# Patient Record
Sex: Female | Born: 1960 | Race: Black or African American | Hispanic: No | Marital: Married | Smoking: Former smoker
Health system: Southern US, Community
[De-identification: ages and names within clinical notes are randomized; demographics above are authoritative.]

## PROBLEM LIST (undated history)

## (undated) DIAGNOSIS — R569 Unspecified convulsions: Secondary | ICD-10-CM

---

## 2008-10-29 ENCOUNTER — Emergency Department: Payer: Self-pay | Admitting: Emergency Medicine

## 2012-05-12 ENCOUNTER — Emergency Department: Payer: Self-pay | Admitting: Emergency Medicine

## 2012-05-12 LAB — CBC
HGB: 10.2 g/dL — ABNORMAL LOW (ref 12.0–16.0)
MCH: 35.1 pg — ABNORMAL HIGH (ref 26.0–34.0)
MCV: 101 fL — ABNORMAL HIGH (ref 80–100)
RBC: 2.91 10*6/uL — ABNORMAL LOW (ref 3.80–5.20)
RDW: 19.4 % — ABNORMAL HIGH (ref 11.5–14.5)
WBC: 4.2 10*3/uL (ref 3.6–11.0)

## 2012-05-12 LAB — BASIC METABOLIC PANEL
Chloride: 104 mmol/L (ref 98–107)
EGFR (African American): >60
Osmolality: 274 (ref 275–301)
Potassium: 3.5 mmol/L (ref 3.5–5.1)

## 2012-08-10 ENCOUNTER — Emergency Department: Payer: Self-pay | Admitting: Emergency Medicine

## 2012-08-10 LAB — URINALYSIS, COMPLETE
Bilirubin,UR: NEGATIVE
Blood: NEGATIVE
Ketone: NEGATIVE
Nitrite: NEGATIVE
Protein: NEGATIVE
Specific Gravity: 1.039 (ref 1.003–1.030)
Squamous Epithelial: 1
WBC UR: <1 /HPF (ref 0–5)

## 2012-08-10 LAB — COMPREHENSIVE METABOLIC PANEL
Alkaline Phosphatase: 163 U/L — ABNORMAL HIGH (ref 50–136)
Anion Gap: 11 (ref 7–16)
BUN: 8 mg/dL (ref 7–18)
Creatinine: 0.74 mg/dL (ref 0.60–1.30)
EGFR (African American): >60
EGFR (Non-African Amer.): >60
Glucose: 161 mg/dL — ABNORMAL HIGH (ref 65–99)
Osmolality: 276 (ref 275–301)
Potassium: 3.5 mmol/L (ref 3.5–5.1)
Sodium: 137 mmol/L (ref 136–145)

## 2012-08-10 LAB — CBC
HCT: 33.4 % — ABNORMAL LOW (ref 35.0–47.0)
MCHC: 34.5 g/dL (ref 32.0–36.0)
MCV: 99 fL (ref 80–100)
RBC: 3.37 10*6/uL — ABNORMAL LOW (ref 3.80–5.20)
WBC: 3.1 10*3/uL — ABNORMAL LOW (ref 3.6–11.0)

## 2012-08-11 ENCOUNTER — Ambulatory Visit: Payer: Self-pay | Admitting: Oncology

## 2012-08-11 LAB — CBC CANCER CENTER
Basophil %: 0.5 %
Eosinophil #: 0 x10 3/mm (ref 0.0–0.7)
HCT: 33.8 % — ABNORMAL LOW (ref 35.0–47.0)
HGB: 11.6 g/dL — ABNORMAL LOW (ref 12.0–16.0)
Lymphocyte #: 0.5 x10 3/mm — ABNORMAL LOW (ref 1.0–3.6)
MCH: 33.7 pg (ref 26.0–34.0)
Monocyte #: 0.2 x10 3/mm (ref 0.2–0.9)
Neutrophil %: 79 %
RBC: 3.43 10*6/uL — ABNORMAL LOW (ref 3.80–5.20)
RDW: 14.3 % (ref 11.5–14.5)

## 2012-08-11 LAB — FOLATE: Folic Acid: 19.1 ng/mL (ref 3.1–100.0)

## 2012-08-11 LAB — ETHANOL: Ethanol: <3 mg/dL

## 2012-08-11 LAB — IRON AND TIBC
Iron Bind.Cap.(Total): 324 ug/dL (ref 250–450)
Iron Saturation: 29 %
Iron: 94 ug/dL (ref 50–170)
Unbound Iron-Bind.Cap.: 230 ug/dL

## 2012-08-11 LAB — FERRITIN: Ferritin (ARMC): 1679 ng/mL — ABNORMAL HIGH (ref 8–388)

## 2012-08-11 LAB — LACTATE DEHYDROGENASE: LDH: 281 U/L — ABNORMAL HIGH (ref 81–246)

## 2012-08-13 LAB — URINE IEP, RANDOM

## 2012-08-14 ENCOUNTER — Ambulatory Visit: Payer: Self-pay | Admitting: Oncology

## 2012-08-20 LAB — CBC CANCER CENTER
Eosinophil #: 0.1 x10 3/mm (ref 0.0–0.7)
Eosinophil %: 1.8 %
HGB: 11.5 g/dL — ABNORMAL LOW (ref 12.0–16.0)
Lymphocyte #: 1.2 x10 3/mm (ref 1.0–3.6)
MCH: 34.9 pg — ABNORMAL HIGH (ref 26.0–34.0)
MCHC: 34.7 g/dL (ref 32.0–36.0)
Monocyte #: 0.8 x10 3/mm (ref 0.2–0.9)
Neutrophil #: 3 x10 3/mm (ref 1.4–6.5)
Neutrophil %: 58.1 %
Platelet: 225 x10 3/mm (ref 150–440)
RBC: 3.3 10*6/uL — ABNORMAL LOW (ref 3.80–5.20)
RDW: 13.6 % (ref 11.5–14.5)

## 2012-09-14 ENCOUNTER — Ambulatory Visit: Payer: Self-pay | Admitting: Oncology

## 2012-09-17 LAB — CBC CANCER CENTER
Basophil %: 1.1 %
Eosinophil %: 4.4 %
HCT: 35.6 % (ref 35.0–47.0)
Lymphocyte %: 23.9 %
MCH: 32.2 pg (ref 26.0–34.0)
MCV: 96 fL (ref 80–100)
Neutrophil #: 4.2 x10 3/mm (ref 1.4–6.5)
Neutrophil %: 61 %
Platelet: 209 x10 3/mm (ref 150–440)
RBC: 3.71 10*6/uL — ABNORMAL LOW (ref 3.80–5.20)

## 2012-10-14 ENCOUNTER — Ambulatory Visit: Payer: Self-pay | Admitting: Oncology

## 2012-10-15 LAB — CBC CANCER CENTER
Basophil %: 0.9 %
Eosinophil #: 0.3 x10 3/mm (ref 0.0–0.7)
Eosinophil %: 4.4 %
HGB: 12.2 g/dL (ref 12.0–16.0)
Lymphocyte #: 1.7 x10 3/mm (ref 1.0–3.6)
Lymphocyte %: 26.7 %
MCH: 32.1 pg (ref 26.0–34.0)
Monocyte #: 0.6 x10 3/mm (ref 0.2–0.9)
Monocyte %: 9 %
Neutrophil %: 59 %
Platelet: 240 x10 3/mm (ref 150–440)
RDW: 13 % (ref 11.5–14.5)

## 2012-11-14 ENCOUNTER — Ambulatory Visit: Payer: Self-pay | Admitting: Oncology

## 2012-12-07 LAB — CBC CANCER CENTER
Basophil #: 0.1 x10 3/mm (ref 0.0–0.1)
Basophil %: 1.1 %
Eosinophil #: 0.2 x10 3/mm (ref 0.0–0.7)
HCT: 34.5 % — ABNORMAL LOW (ref 35.0–47.0)
HGB: 11.6 g/dL — ABNORMAL LOW (ref 12.0–16.0)
Lymphocyte %: 27.2 %
MCV: 90 fL (ref 80–100)
Monocyte #: 0.4 x10 3/mm (ref 0.2–0.9)
Monocyte %: 7.1 %
Platelet: 231 x10 3/mm (ref 150–440)
RBC: 3.83 10*6/uL (ref 3.80–5.20)
WBC: 5.1 x10 3/mm (ref 3.6–11.0)

## 2012-12-14 ENCOUNTER — Ambulatory Visit: Payer: Self-pay | Admitting: Oncology

## 2013-01-29 ENCOUNTER — Ambulatory Visit: Payer: Self-pay | Admitting: Oncology

## 2013-01-29 LAB — CBC CANCER CENTER
BASOS PCT: 1.4 %
Basophil #: 0.1 x10 3/mm (ref 0.0–0.1)
EOS PCT: 4 %
Eosinophil #: 0.2 x10 3/mm (ref 0.0–0.7)
HCT: 37 % (ref 35.0–47.0)
HGB: 12.3 g/dL (ref 12.0–16.0)
Lymphocyte #: 1.4 x10 3/mm (ref 1.0–3.6)
Lymphocyte %: 28.3 %
MCH: 31.4 pg (ref 26.0–34.0)
MCHC: 33.2 g/dL (ref 32.0–36.0)
MCV: 95 fL (ref 80–100)
Monocyte #: 0.5 x10 3/mm (ref 0.2–0.9)
Monocyte %: 9.6 %
Neutrophil #: 2.8 x10 3/mm (ref 1.4–6.5)
Neutrophil %: 56.7 %
PLATELETS: 163 x10 3/mm (ref 150–440)
RBC: 3.92 10*6/uL (ref 3.80–5.20)
RDW: 14.9 % — AB (ref 11.5–14.5)
WBC: 4.9 x10 3/mm (ref 3.6–11.0)

## 2013-02-14 ENCOUNTER — Ambulatory Visit: Payer: Self-pay | Admitting: Oncology

## 2013-03-14 ENCOUNTER — Ambulatory Visit: Payer: Self-pay | Admitting: Oncology

## 2015-04-11 ENCOUNTER — Encounter: Payer: Self-pay | Admitting: Emergency Medicine

## 2015-04-11 ENCOUNTER — Emergency Department
Admission: EM | Admit: 2015-04-11 | Discharge: 2015-04-11 | Disposition: A | Discharge status: Home / Self Care | Payer: BLUE CROSS/BLUE SHIELD | Attending: Emergency Medicine | Admitting: Emergency Medicine

## 2015-04-11 DIAGNOSIS — B348 Other viral infections of unspecified site: Secondary | ICD-10-CM | POA: Diagnosis not present

## 2015-04-11 DIAGNOSIS — R112 Nausea with vomiting, unspecified: Secondary | ICD-10-CM | POA: Insufficient documentation

## 2015-04-11 DIAGNOSIS — A0811 Acute gastroenteropathy due to Norwalk agent: Secondary | ICD-10-CM

## 2015-04-11 DIAGNOSIS — R55 Syncope and collapse: Secondary | ICD-10-CM | POA: Insufficient documentation

## 2015-04-11 DIAGNOSIS — Z87891 Personal history of nicotine dependence: Secondary | ICD-10-CM | POA: Diagnosis not present

## 2015-04-11 DIAGNOSIS — R197 Diarrhea, unspecified: Secondary | ICD-10-CM | POA: Insufficient documentation

## 2015-04-11 LAB — COMPREHENSIVE METABOLIC PANEL
ALT: 54 U/L (ref 14–54)
AST: 157 U/L — AB (ref 15–41)
Albumin: 4.4 g/dL (ref 3.5–5.0)
Alkaline Phosphatase: 79 U/L (ref 38–126)
Anion gap: 16 — ABNORMAL HIGH (ref 5–15)
BUN: 18 mg/dL (ref 6–20)
CHLORIDE: 97 mmol/L — AB (ref 101–111)
CO2: 21 mmol/L — ABNORMAL LOW (ref 22–32)
CREATININE: 0.74 mg/dL (ref 0.44–1.00)
Calcium: 8.7 mg/dL — ABNORMAL LOW (ref 8.9–10.3)
GFR calc Af Amer: >60 mL/min (ref >60)
Glucose, Bld: 161 mg/dL — ABNORMAL HIGH (ref 65–99)
Potassium: 3 mmol/L — ABNORMAL LOW (ref 3.5–5.1)
Sodium: 134 mmol/L — ABNORMAL LOW (ref 135–145)
Total Bilirubin: 1.7 mg/dL — ABNORMAL HIGH (ref 0.3–1.2)
Total Protein: 8.4 g/dL — ABNORMAL HIGH (ref 6.5–8.1)

## 2015-04-11 LAB — CBC WITH DIFFERENTIAL/PLATELET
BASOS ABS: 0 10*3/uL (ref 0–0.1)
Basophils Relative: 0 %
EOS PCT: 0 %
Eosinophils Absolute: 0 10*3/uL (ref 0–0.7)
HEMATOCRIT: 35.1 % (ref 35.0–47.0)
Hemoglobin: 12.1 g/dL (ref 12.0–16.0)
LYMPHS PCT: 9 %
Lymphs Abs: 0.5 10*3/uL — ABNORMAL LOW (ref 1.0–3.6)
MCH: 34.5 pg — ABNORMAL HIGH (ref 26.0–34.0)
MCHC: 34.4 g/dL (ref 32.0–36.0)
MCV: 100.4 fL — AB (ref 80.0–100.0)
Monocytes Absolute: 0.3 10*3/uL (ref 0.2–0.9)
Monocytes Relative: 5 %
NEUTROS ABS: 5.4 10*3/uL (ref 1.4–6.5)
NEUTROS PCT: 86 %
PLATELETS: 127 10*3/uL — AB (ref 150–440)
RBC: 3.49 MIL/uL — AB (ref 3.80–5.20)
RDW: 12.3 % (ref 11.5–14.5)
WBC: 6.2 10*3/uL (ref 3.6–11.0)

## 2015-04-11 LAB — TROPONIN I

## 2015-04-11 MED ORDER — SODIUM CHLORIDE 0.9 % IV SOLN
1000.0000 mL | Freq: Once | INTRAVENOUS | Status: AC
  Administered 2015-04-11: 1000 mL via INTRAVENOUS

## 2015-04-11 MED ORDER — LOPERAMIDE HCL 2 MG PO CAPS
4.0000 mg | ORAL_CAPSULE | Freq: Once | ORAL | Status: AC
  Administered 2015-04-11: 4 mg via ORAL
  Filled 2015-04-11: qty 2

## 2015-04-11 MED ORDER — ONDANSETRON HCL 4 MG/2ML IJ SOLN
4.0000 mg | Freq: Once | INTRAMUSCULAR | Status: AC
  Administered 2015-04-11: 4 mg via INTRAVENOUS
  Filled 2015-04-11: qty 2

## 2015-04-11 MED ORDER — ONDANSETRON HCL 4 MG PO TABS: 4.0000 mg | ORAL_TABLET | Freq: Every day | ORAL | Status: DC | PRN

## 2015-04-11 MED ORDER — SODIUM CHLORIDE 0.9 % IV SOLN
Freq: Once | INTRAVENOUS | Status: AC
  Administered 2015-04-11: 15:00:00 via INTRAVENOUS

## 2015-04-11 NOTE — ED Notes (Signed)
MD at bedside. 

## 2015-04-11 NOTE — ED Notes (Signed)
Resumed care from steven rn.  Pt alert.  Denies pain.  nsr on monitor.  Skin warm and dry,.   Iv in place.  siderails up x 2.

## 2015-04-11 NOTE — ED Notes (Signed)
D/c inst to pt.   

## 2015-04-11 NOTE — Discharge Instructions (Signed)
Syncope °Syncope is a medical term for fainting or passing out. This means you lose consciousness and drop to the ground. People are generally unconscious for less than 5 minutes. You may have some muscle twitches for up to 15 seconds before waking up and returning to normal. Syncope occurs more often in older adults, but it can happen to anyone. While most causes of syncope are not dangerous, syncope can be a sign of a serious medical problem. It is important to seek medical care.  °CAUSES  °Syncope is caused by a sudden drop in blood flow to the brain. The specific cause is often not determined. Factors that can bring on syncope include: °· Taking medicines that lower blood pressure. °· Sudden changes in posture, such as standing up quickly. °· Taking more medicine than prescribed. °· Standing in one place for too long. °· Seizure disorders. °· Dehydration and excessive exposure to heat. °· Low blood sugar (hypoglycemia). °· Straining to have a bowel movement. °· Heart disease, irregular heartbeat, or other circulatory problems. °· Fear, emotional distress, seeing blood, or severe pain. °SYMPTOMS  °Right before fainting, you may: °· Feel dizzy or light-headed. °· Feel nauseous. °· See all white or all black in your field of vision. °· Have cold, clammy skin. °DIAGNOSIS  °Your health care provider will ask about your symptoms, perform a physical exam, and perform an electrocardiogram (ECG) to record the electrical activity of your heart. Your health care provider may also perform other heart or blood tests to determine the cause of your syncope which may include: °· Transthoracic echocardiogram (TTE). During echocardiography, sound waves are used to evaluate how blood flows through your heart. °· Transesophageal echocardiogram (TEE). °· Cardiac monitoring. This allows your health care provider to monitor your heart rate and rhythm in real time. °· Holter monitor. This is a portable device that records your  heartbeat and can help diagnose heart arrhythmias. It allows your health care provider to track your heart activity for several days, if needed. °· Stress tests by exercise or by giving medicine that makes the heart beat faster. °TREATMENT  °In most cases, no treatment is needed. Depending on the cause of your syncope, your health care provider may recommend changing or stopping some of your medicines. °HOME CARE INSTRUCTIONS °· Have someone stay with you until you feel stable. °· Do not drive, use machinery, or play sports until your health care provider says it is okay. °· Keep all follow-up appointments as directed by your health care provider. °· Lie down right away if you start feeling like you might faint. Breathe deeply and steadily. Wait until all the symptoms have passed. °· Drink enough fluids to keep your urine clear or pale yellow. °· If you are taking blood pressure or heart medicine, get up slowly and take several minutes to sit and then stand. This can reduce dizziness. °SEEK IMMEDIATE MEDICAL CARE IF:  °· You have a severe headache. °· You have unusual pain in the chest, abdomen, or back. °· You are bleeding from your mouth or rectum, or you have black or tarry stool. °· You have an irregular or very fast heartbeat. °· You have pain with breathing. °· You have repeated fainting or seizure-like jerking during an episode. °· You faint when sitting or lying down. °· You have confusion. °· You have trouble walking. °· You have severe weakness. °· You have vision problems. °If you fainted, call your local emergency services (911 in [place] Do not drive   yourself to the hospital.    This information is not intended to replace advice given to you by your health care provider. Make sure you discuss any questions you have with your health care provider.   Document Released: 12/31/2004 Document Revised: 05/17/2014 Document Reviewed: 03/01/2011 Elsevier Interactive Patient Education 2016 [employer]  Viral Gastroenteritis Viral gastroenteritis is also known as stomach flu. This condition affects the stomach and intestinal tract. It can cause sudden diarrhea and vomiting. The illness typically lasts 3 to 8 days. Most people develop an immune response that eventually gets rid of the virus. While this natural response develops, the virus can make you quite ill. CAUSES  Many different viruses can cause gastroenteritis, such as rotavirus or noroviruses. You can catch one of these viruses by consuming contaminated food or water. You may also catch a virus by sharing utensils or other personal items with an infected person or by touching a contaminated surface. SYMPTOMS  The most common symptoms are diarrhea and vomiting. These problems can cause a severe loss of body fluids (dehydration) and a body salt (electrolyte) imbalance. Other symptoms may include:  Fever.  Headache.  Fatigue.  Abdominal pain. DIAGNOSIS  Your caregiver can usually diagnose viral gastroenteritis based on your symptoms and a physical exam. A stool sample may also be taken to test for the presence of viruses or other infections. TREATMENT  This illness typically goes away on its own. Treatments are aimed at rehydration. The most serious cases of viral gastroenteritis involve vomiting so severely that you are not able to keep fluids down. In these cases, fluids must be given through an intravenous line (IV). HOME CARE INSTRUCTIONS   Drink enough fluids to keep your urine clear or pale yellow. Drink small amounts of fluids frequently and increase the amounts as tolerated.  Ask your caregiver for specific rehydration instructions.  Avoid:  Foods high in sugar.  Alcohol.  Carbonated drinks.  Tobacco.  Juice.  Caffeine drinks.  Extremely hot or cold fluids.  Fatty, greasy foods.  Too much intake of anything at one time.  Dairy products until 24 to 48 hours after diarrhea stops.  You may consume  probiotics. Probiotics are active cultures of beneficial bacteria. They may lessen the amount and number of diarrheal stools in adults. Probiotics can be found in yogurt with active cultures and in supplements.  Wash your hands well to avoid spreading the virus.  Only take over-the-counter or prescription medicines for pain, discomfort, or fever as directed by your caregiver. Do not give aspirin to children. Antidiarrheal medicines are not recommended.  Ask your caregiver if you should continue to take your regular prescribed and over-the-counter medicines.  Keep all follow-up appointments as directed by your caregiver. SEEK IMMEDIATE MEDICAL CARE IF:   You are unable to keep fluids down.  You do not urinate at least once every 6 to 8 hours.  You develop shortness of breath.  You notice blood in your stool or vomit. This may look like coffee grounds.  You have abdominal pain that increases or is concentrated in one small area (localized).  You have persistent vomiting or diarrhea.  You have a fever.  The patient is a child younger than 3 months, and he or she has a fever.  The patient is a child older than 3 months, and he or she has a fever and persistent symptoms.  The patient is a child older than 3 months, and he or she has a fever and  symptoms suddenly get worse.  The patient is a baby, and he or she has no tears when crying. MAKE SURE YOU:   Understand these instructions.  Will watch your condition.  Will get help right away if you are not doing well or get worse.   This information is not intended to replace advice given to you by your health care provider. Make sure you discuss any questions you have with your health care provider.   Document Released: 12/31/2004 Document Revised: 03/25/2011 Document Reviewed: 10/17/2010 Elsevier Interactive Patient Education [employer].

## 2015-04-11 NOTE — ED Provider Notes (Signed)
[facility] Emergency Department Provider Note     Time seen: ----------------------------------------- 2:34 PM on 04/11/2015 -----------------------------------------    I have reviewed the triage vital signs and the nursing notes.   HISTORY  Chief Complaint Loss of Consciousness    HPI Holly Whitney is a 55 y.o. female who presents to ER after a syncopal or seizure-like event. Patient was at work when she fell and loss of consciousness. She was pale and diaphoretic and states she's had 3-4 days of diarrhea with nausea and vomiting. Initially presents slightly tachycardic, she complains of weakness and nausea but no other complaints at this time. Patient states she does drink alcohol but not every day, denies drug use.   No past medical history on file.  There are no active problems to display for this patient.   No past surgical history on file.  Allergies Review of patient's allergies indicates not on file.  Social History Social History  Substance Use Topics  . Smoking status: Not on file  . Smokeless tobacco: Not on file  . Alcohol Use: Not on file    Review of Systems Constitutional: Negative for fever. Eyes: Negative for visual changes. ENT: Negative for sore throat. Cardiovascular: Negative for chest pain. Respiratory: Negative for shortness of breath. Gastrointestinal: Negative for abdominal pain,Positive for nausea, vomiting and diarrhea Genitourinary: Negative for dysuria. Musculoskeletal: Negative for back pain. Skin: Positive for diaphoresis Neurological: Negative for headaches,Positive for weakness  10-point ROS otherwise negative.  ____________________________________________   PHYSICAL EXAM:  VITAL SIGNS: ED Triage Vitals  Enc Vitals Group     BP --      Pulse --      Resp --      Temp --      Temp src --      SpO2 --      Weight --      Height --      Head Cir --      Peak Flow --      Pain Score --       Pain Loc --      Pain Edu? --      Excl. in GC? --     Constitutional: Alert and oriented. Unkempt appearance, no distress Eyes: Conjunctivae are normal. PERRL. Normal extraocular movements. ENT   Head: Normocephalic and atraumatic.   Nose: No congestion/rhinnorhea.   Mouth/Throat: Mucous membranes are moist.   Neck: No stridor. Cardiovascular: Normal rate, regular rhythm. Normal and symmetric distal pulses are present in all extremities. No murmurs, rubs, or gallops. Respiratory: Normal respiratory effort without tachypnea nor retractions. Breath sounds are clear and equal bilaterally. No wheezes/rales/rhonchi. Gastrointestinal: Soft and nontender. No distention. No abdominal bruits.  Musculoskeletal: Nontender with normal range of motion in all extremities. No joint effusions.  No lower extremity tenderness nor edema. Neurologic:  Normal speech and language. Generalized weakness, nothing focal Skin:  Skin is warm, dry and intact. No rash noted. Psychiatric: Mood and affect are normal. Speech and behavior are normal. Patient exhibits appropriate insight and judgment. ____________________________________________  EKG: Interpreted by me. Sinus tachycardia with a rate of 102 bpm, first degree AV block, normal QRS, long QT interval. Normal axis.  ____________________________________________  ED COURSE:  Pertinent labs & imaging results that were available during my care of the patient were reviewed by me and considered in my medical decision making (see chart for details).  Patient presented with what is likely syncope after Norovirus symptoms. She'll be given  fluids, antiemetics and we will reevaluate. ____________________________________________    LABS (pertinent positives/negatives)  Labs Reviewed  CBC WITH DIFFERENTIAL/PLATELET  COMPREHENSIVE METABOLIC PANEL  TROPONIN I  URINALYSIS COMPLETEWITH MICROSCOPIC ([facility] ONLY)    ____________________________________________  FINAL ASSESSMENT AND PLAN  Syncope, vomiting and diarrhea  Plan: Patient with labs as dictated above. Patient is in no acute distress, labs and results are pending at this time.   Emily FilbertWilliams, Madeline Pho E, MD   Emily FilbertJonathan E Maurie Olesen, MD 04/11/15 [phone]

## 2015-04-11 NOTE — ED Notes (Signed)
Iv fluids infused.  Pt ambulatory without diff.  States i feel better.  Family with pt.  md aware.

## 2015-04-11 NOTE — ED Notes (Signed)
[facility]: syncopal vs seizure like activity per witness at scene.  Patient was at work when she fell and had loc, unsure of details as to nature of seizure like activity as witnesses were gone by the time ems arrived.  Patient was pale, clammy, and diaphoretic and states that she has had 3 days of nausea, vomiting, and diarrhea.  Patient's bp was 112/62, sinus tach at 110, and cbg was 159 with EMS.  She was given 300 cc's of nacl prior to arrival through a 20 guage in the left wrist that was started by EMS. patient is concious, alert, and oriented x 3.

## 2015-12-25 ENCOUNTER — Emergency Department
Admission: EM | Admit: 2015-12-25 | Discharge: 2015-12-25 | Disposition: A | Discharge status: Home / Self Care | Payer: BLUE CROSS/BLUE SHIELD | Attending: Emergency Medicine | Admitting: Emergency Medicine

## 2015-12-25 DIAGNOSIS — Z79899 Other long term (current) drug therapy: Secondary | ICD-10-CM | POA: Diagnosis not present

## 2015-12-25 DIAGNOSIS — Z87891 Personal history of nicotine dependence: Secondary | ICD-10-CM | POA: Diagnosis not present

## 2015-12-25 DIAGNOSIS — R55 Syncope and collapse: Secondary | ICD-10-CM | POA: Insufficient documentation

## 2015-12-25 LAB — CBC
HEMATOCRIT: 36.2 % (ref 35.0–47.0)
HEMOGLOBIN: 12.2 g/dL (ref 12.0–16.0)
MCH: 34.5 pg — ABNORMAL HIGH (ref 26.0–34.0)
MCHC: 33.7 g/dL (ref 32.0–36.0)
MCV: 102.4 fL — AB (ref 80.0–100.0)
Platelets: 152 10*3/uL (ref 150–440)
RBC: 3.53 MIL/uL — ABNORMAL LOW (ref 3.80–5.20)
RDW: 13 % (ref 11.5–14.5)
WBC: 6 10*3/uL (ref 3.6–11.0)

## 2015-12-25 LAB — BASIC METABOLIC PANEL
ANION GAP: 10 (ref 5–15)
BUN: 17 mg/dL (ref 6–20)
CHLORIDE: 104 mmol/L (ref 101–111)
CO2: 22 mmol/L (ref 22–32)
Calcium: 9.1 mg/dL (ref 8.9–10.3)
Creatinine, Ser: 0.62 mg/dL (ref 0.44–1.00)
GFR calc non Af Amer: >60 mL/min (ref >60)
GLUCOSE: 127 mg/dL — AB (ref 65–99)
Potassium: 3.3 mmol/L — ABNORMAL LOW (ref 3.5–5.1)
Sodium: 136 mmol/L (ref 135–145)

## 2015-12-25 LAB — TROPONIN I: Troponin I: <0.03 ng/mL (ref <0.03)

## 2015-12-25 MED ORDER — SODIUM CHLORIDE 0.9 % IV BOLUS (SEPSIS)
1000.0000 mL | Freq: Once | INTRAVENOUS | Status: AC
  Administered 2015-12-25: 1000 mL via INTRAVENOUS

## 2015-12-25 NOTE — ED Notes (Signed)
Informed RN of need  For recollect of light green tube, called from Lab 1215

## 2015-12-25 NOTE — ED Notes (Signed)
ED Provider at bedside. 

## 2015-12-25 NOTE — ED Triage Notes (Signed)
Pt arrives to ER via [facility] from work after seizure like activity. Pt postictal on scene. HX of 1 prior seizure, does not take seizure medication. Pt alert and oriented X 4 at time of arrival. CBG normal. Pt does report that she felt dizzy this AM but does not complain of anything at this time.

## 2015-12-25 NOTE — ED Provider Notes (Signed)
[facility] Emergency Department Provider Note  ____________________________________________   First MD Initiated Contact with Patient 12/25/15 1227     (approximate)  I have reviewed the triage vital signs and the nursing notes.   HISTORY  Chief Complaint Seizures   HPI Germaine PomfretCarolyn B Burditt is a 55 y.o. female with a history of a syncopal episode one year ago who is presenting to the emergency department today with a syncopal episode. She says that she was not witnessed having any seizure-like activity. She says that she was drinking water this morning but tonight breakfast which she normally does. She says that she got up quickly at work and then became lightheaded and passed out. She does not report losing bowel or bladder control. Does not know exactly how long she was passed out. Denies any chest pain or shortness of breath. Denies any pain including headache. Says that her episode one year ago similar. She does not a primary care doctor or cardiologist. She denies having any medical problems or taking any medications on a regular basis.   History reviewed. No pertinent past medical history.  There are no active problems to display for this patient.   History reviewed. No pertinent surgical history.  Prior to Admission medications   Medication Sig Start Date End Date Taking? Authorizing Provider  ondansetron (ZOFRAN) 4 MG tablet Take 1 tablet (4 mg total) by mouth daily as needed for nausea or vomiting. 04/11/15   Emily FilbertJonathan E Williams, MD    Allergies Patient has no known allergies.  No family history on file.  Social History Social History  Substance Use Topics  . Smoking status: Former Smoker    Types: Cigarettes    Quit date: 04/10/2009  . Smokeless tobacco: Not on file  . Alcohol use No    Review of Systems Constitutional: No fever/chills Eyes: No visual changes. ENT: No sore throat. Cardiovascular: Denies chest pain. Respiratory: Denies  shortness of breath. Gastrointestinal: No abdominal pain.  No nausea, no vomiting.  No diarrhea.  No constipation. Genitourinary: Negative for dysuria. Musculoskeletal: Negative for back pain. Skin: Negative for rash. Neurological: Negative for headaches, focal weakness or numbness.  10-point ROS otherwise negative.  ____________________________________________   PHYSICAL EXAM:  VITAL SIGNS: ED Triage Vitals  Enc Vitals Group     BP 12/25/15 1130 127/76     Pulse Rate 12/25/15 1130 (!) 103     Resp 12/25/15 1130 18     Temp 12/25/15 1130 98.5 F (36.9 C)     Temp Source 12/25/15 1130 Oral     SpO2 12/25/15 1130 95 %     Weight 12/25/15 1131 150 lb (68 kg)     Height 12/25/15 1131 5\' 2"  (1.575 m)     Head Circumference --      Peak Flow --      Pain Score --      Pain Loc --      Pain Edu? --      Excl. in GC? --     Constitutional: Alert and oriented. Well appearing and in no acute distress. Eyes: Conjunctivae are normal. PERRL. EOMI. Head: Mild swelling over the left periorbital area superior to the eye. No depression. Mild tenderness to palpation with minimal ecchymosis. Nose: No congestion/rhinnorhea. Mouth/Throat: Mucous membranes are moist.  No tongue bite. Neck: No stridor.   Cardiovascular: Normal rate, regular rhythm. Grossly normal heart sounds.   Respiratory: Normal respiratory effort.  No retractions. Lungs CTAB. Gastrointestinal: Soft and  nontender. No distention.  Musculoskeletal: No lower extremity tenderness nor edema.  No joint effusions. Neurologic:  Normal speech and language. No gross focal neurologic deficits are appreciated.  Skin:  Skin is warm, dry and intact. No rash noted. Psychiatric: Mood and affect are normal. Speech and behavior are normal.  ____________________________________________   LABS (all labs ordered are listed, but only abnormal results are displayed)  Labs Reviewed  CBC - Abnormal; Notable for the following:       Result  Value   RBC 3.53 (*)    MCV 102.4 (*)    MCH 34.5 (*)    All other components within normal limits  BASIC METABOLIC PANEL - Abnormal; Notable for the following:    Potassium 3.3 (*)    Glucose, Bld 127 (*)    All other components within normal limits  TROPONIN I   ____________________________________________  EKG  ED ECG REPORT I, Arelia LongestSchaevitz,  David M, the attending physician, personally viewed and interpreted this ECG.   Date: 12/25/2015  EKG Time: 1131  Rate: 106  Rhythm: sinus tachycardia  Axis: Normal  Intervals:none  ST&T Change: No ST segment elevation or depression. No abnormal T-wave inversion.  ____________________________________________  RADIOLOGY   ____________________________________________   PROCEDURES  Procedure(s) performed:   Procedures  Critical Care performed:   ____________________________________________   INITIAL IMPRESSION / ASSESSMENT AND PLAN / ED COURSE  Pertinent labs & imaging results that were available during my care of the patient were reviewed by me and considered in my medical decision making (see chart for details).   ----------------------------------------- 1:48 PM on 12/25/2015 -----------------------------------------  Patient resting comfortable at this time. Continues without any complaint. Heart rate is 90 bpm after fluids. Likely vasovagal episode. We'll discharge with cardiology as well as primary care follow-up. She is understanding of plan and willing to comply. We also discussed precautionary measures is makes her stay hydrated and sitting down or she feels like she is going to pass out.  Clinical Course      ____________________________________________   FINAL CLINICAL IMPRESSION(S) / ED DIAGNOSES  Syncope.    NEW MEDICATIONS STARTED DURING THIS VISIT:  New Prescriptions   No medications on file     Note:  This document was prepared using Dragon voice recognition software and may include  unintentional dictation errors.    Myrna Blazeravid Matthew Schaevitz, MD 12/25/15 [phone]

## 2015-12-25 NOTE — ED Notes (Signed)
Pt alert and oriented X4, active, cooperative, pt in NAD. RR even and unlabored, color WNL.  Pt informed to return if any life threatening symptoms occur.   

## 2016-07-03 ENCOUNTER — Encounter: Payer: Self-pay | Admitting: Emergency Medicine

## 2016-07-03 ENCOUNTER — Inpatient Hospital Stay
Admission: EM | Admit: 2016-07-03 | Discharge: 2016-07-05 | DRG: 897 | Disposition: A | Discharge status: Home / Self Care | Payer: Self-pay | Attending: Internal Medicine | Admitting: Internal Medicine

## 2016-07-03 ENCOUNTER — Emergency Department: Payer: Self-pay

## 2016-07-03 ENCOUNTER — Emergency Department
Admission: EM | Admit: 2016-07-03 | Discharge: 2016-07-03 | Disposition: A | Discharge status: Home / Self Care | Payer: BLUE CROSS/BLUE SHIELD | Attending: Emergency Medicine | Admitting: Emergency Medicine

## 2016-07-03 DIAGNOSIS — Z87891 Personal history of nicotine dependence: Secondary | ICD-10-CM

## 2016-07-03 DIAGNOSIS — G40909 Epilepsy, unspecified, not intractable, without status epilepticus: Secondary | ICD-10-CM

## 2016-07-03 DIAGNOSIS — E876 Hypokalemia: Secondary | ICD-10-CM | POA: Diagnosis present

## 2016-07-03 DIAGNOSIS — E440 Moderate protein-calorie malnutrition: Secondary | ICD-10-CM | POA: Diagnosis present

## 2016-07-03 DIAGNOSIS — G40409 Other generalized epilepsy and epileptic syndromes, not intractable, without status epilepticus: Secondary | ICD-10-CM | POA: Diagnosis present

## 2016-07-03 DIAGNOSIS — F10239 Alcohol dependence with withdrawal, unspecified: Principal | ICD-10-CM | POA: Diagnosis present

## 2016-07-03 DIAGNOSIS — R569 Unspecified convulsions: Secondary | ICD-10-CM

## 2016-07-03 DIAGNOSIS — T675XXA Heat exhaustion, unspecified, initial encounter: Secondary | ICD-10-CM | POA: Insufficient documentation

## 2016-07-03 DIAGNOSIS — R740 Nonspecific elevation of levels of transaminase and lactic acid dehydrogenase [LDH]: Secondary | ICD-10-CM | POA: Insufficient documentation

## 2016-07-03 DIAGNOSIS — Z6824 Body mass index (BMI) 24.0-24.9, adult: Secondary | ICD-10-CM

## 2016-07-03 DIAGNOSIS — D696 Thrombocytopenia, unspecified: Secondary | ICD-10-CM | POA: Diagnosis present

## 2016-07-03 DIAGNOSIS — R0902 Hypoxemia: Secondary | ICD-10-CM | POA: Diagnosis present

## 2016-07-03 DIAGNOSIS — R413 Other amnesia: Secondary | ICD-10-CM | POA: Diagnosis present

## 2016-07-03 DIAGNOSIS — R7401 Elevation of levels of liver transaminase levels: Secondary | ICD-10-CM

## 2016-07-03 LAB — CBC
HEMATOCRIT: 35 % (ref 35.0–47.0)
HEMOGLOBIN: 12 g/dL (ref 12.0–16.0)
MCH: 34 pg (ref 26.0–34.0)
MCHC: 34.3 g/dL (ref 32.0–36.0)
MCV: 99.1 fL (ref 80.0–100.0)
Platelets: 90 10*3/uL — ABNORMAL LOW (ref 150–440)
RBC: 3.53 MIL/uL — ABNORMAL LOW (ref 3.80–5.20)
RDW: 15.3 % — AB (ref 11.5–14.5)
WBC: 6.8 10*3/uL (ref 3.6–11.0)

## 2016-07-03 LAB — CBC WITH DIFFERENTIAL/PLATELET
Basophils Absolute: 0 10*3/uL (ref 0–0.1)
Basophils Relative: 1 %
Eosinophils Absolute: 0 10*3/uL (ref 0–0.7)
Eosinophils Relative: 0 %
HEMATOCRIT: 35.4 % (ref 35.0–47.0)
HEMOGLOBIN: 12.1 g/dL (ref 12.0–16.0)
LYMPHS ABS: 0.4 10*3/uL — AB (ref 1.0–3.6)
LYMPHS PCT: 10 %
MCH: 34.6 pg — ABNORMAL HIGH (ref 26.0–34.0)
MCHC: 34.2 g/dL (ref 32.0–36.0)
MCV: 101.2 fL — AB (ref 80.0–100.0)
MONOS PCT: 8 %
Monocytes Absolute: 0.3 10*3/uL (ref 0.2–0.9)
NEUTROS ABS: 3.4 10*3/uL (ref 1.4–6.5)
NEUTROS PCT: 81 %
Platelets: 98 10*3/uL — ABNORMAL LOW (ref 150–440)
RBC: 3.49 MIL/uL — ABNORMAL LOW (ref 3.80–5.20)
RDW: 15.7 % — ABNORMAL HIGH (ref 11.5–14.5)
WBC: 4.2 10*3/uL (ref 3.6–11.0)

## 2016-07-03 LAB — URINALYSIS, COMPLETE (UACMP) WITH MICROSCOPIC
BACTERIA UA: NONE SEEN
BILIRUBIN URINE: NEGATIVE
Glucose, UA: NEGATIVE mg/dL
KETONES UR: NEGATIVE mg/dL
Nitrite: NEGATIVE
PROTEIN: 30 mg/dL — AB
SPECIFIC GRAVITY, URINE: 1.02 (ref 1.005–1.030)
pH: 5 (ref 5.0–8.0)

## 2016-07-03 LAB — BASIC METABOLIC PANEL
ANION GAP: 14 (ref 5–15)
ANION GAP: 12 (ref 5–15)
BUN: 13 mg/dL (ref 6–20)
BUN: 9 mg/dL (ref 6–20)
CALCIUM: 8.9 mg/dL (ref 8.9–10.3)
CALCIUM: 8.8 mg/dL — AB (ref 8.9–10.3)
CO2: 21 mmol/L — AB (ref 22–32)
CO2: 22 mmol/L (ref 22–32)
CREATININE: 0.67 mg/dL (ref 0.44–1.00)
Chloride: 101 mmol/L (ref 101–111)
Chloride: 102 mmol/L (ref 101–111)
Creatinine, Ser: 0.71 mg/dL (ref 0.44–1.00)
GFR calc Af Amer: >60 mL/min (ref >60)
GFR calc non Af Amer: >60 mL/min (ref >60)
GLUCOSE: 142 mg/dL — AB (ref 65–99)
Glucose, Bld: 177 mg/dL — ABNORMAL HIGH (ref 65–99)
Potassium: 4 mmol/L (ref 3.5–5.1)
Potassium: 3.1 mmol/L — ABNORMAL LOW (ref 3.5–5.1)
Sodium: 136 mmol/L (ref 135–145)
Sodium: 136 mmol/L (ref 135–145)

## 2016-07-03 LAB — URINE DRUG SCREEN, QUALITATIVE (ARMC ONLY)
AMPHETAMINES, UR SCREEN: NOT DETECTED
BENZODIAZEPINE, UR SCRN: NOT DETECTED
Barbiturates, Ur Screen: NOT DETECTED
Cannabinoid 50 Ng, Ur ~~LOC~~: NOT DETECTED
Cocaine Metabolite,Ur ~~LOC~~: NOT DETECTED
MDMA (Ecstasy)Ur Screen: NOT DETECTED
Methadone Scn, Ur: NOT DETECTED
OPIATE, UR SCREEN: NOT DETECTED
PHENCYCLIDINE (PCP) UR S: NOT DETECTED
Tricyclic, Ur Screen: NOT DETECTED

## 2016-07-03 LAB — HEPATIC FUNCTION PANEL
ALBUMIN: 4.6 g/dL (ref 3.5–5.0)
ALT: 114 U/L — ABNORMAL HIGH (ref 14–54)
AST: 193 U/L — AB (ref 15–41)
Alkaline Phosphatase: 72 U/L (ref 38–126)
BILIRUBIN TOTAL: 1.3 mg/dL — AB (ref 0.3–1.2)
Bilirubin, Direct: 0.3 mg/dL (ref 0.1–0.5)
Indirect Bilirubin: 1 mg/dL — ABNORMAL HIGH (ref 0.3–0.9)
TOTAL PROTEIN: 8.9 g/dL — AB (ref 6.5–8.1)

## 2016-07-03 LAB — GLUCOSE, CAPILLARY: GLUCOSE-CAPILLARY: 127 mg/dL — AB (ref 65–99)

## 2016-07-03 LAB — CK: Total CK: 94 U/L (ref 38–234)

## 2016-07-03 MED ORDER — SODIUM CHLORIDE 0.9 % IV SOLN
1000.0000 mg | Freq: Once | INTRAVENOUS | Status: AC
  Administered 2016-07-03: 1000 mg via INTRAVENOUS
  Filled 2016-07-03: qty 10

## 2016-07-03 MED ORDER — SODIUM CHLORIDE 0.9 % IV BOLUS (SEPSIS)
1000.0000 mL | Freq: Once | INTRAVENOUS | Status: AC
  Administered 2016-07-03: 1000 mL via INTRAVENOUS

## 2016-07-03 MED ORDER — LORAZEPAM 2 MG/ML IJ SOLN
2.0000 mg | Freq: Once | INTRAMUSCULAR | Status: AC
  Administered 2016-07-03: 2 mg via INTRAVENOUS

## 2016-07-03 NOTE — ED Provider Notes (Addendum)
[facility] Emergency Department Provider Note  ____________________________________________   I have reviewed the triage vital signs and the nursing notes.   HISTORY  Chief Complaint Seizures and Weakness    HPI CHARNELL PEPLINSKI is a 56 y.o. female who presents here after a witnessed seizure event. Patient has had syncopal events in the past last year, but never witnessed seizure. She was seen here earlier today for feeling lightheaded in the heat. She felt much better and requested discharge. Family states that after she got home,she was sitting on a couch laughing and joking and had a grand mal tonic clonic seizure which lasted for less than a minute. Was confused afterwards did not bite her tongue was not incontinent. Patient as noted has never known to have a seizure in the past she has had syncopal events after which she was confused. No fever no chills no nausea no vomiting abdominal pain no headache no chest pain no focal numbness or weakness at this time.    History reviewed. No pertinent past medical history.  There are no active problems to display for this patient.   History reviewed. No pertinent surgical history.  Prior to Admission medications   Medication Sig Start Date End Date Taking? Authorizing Provider  ondansetron (ZOFRAN) 4 MG tablet Take 1 tablet (4 mg total) by mouth daily as needed for nausea or vomiting. 04/11/15   Emily Filbert, MD    Allergies Patient has no known allergies.  History reviewed. No pertinent family history.  Social History Social History  Substance Use Topics  . Smoking status: Former Smoker    Types: Cigarettes    Quit date: 04/10/2009  . Smokeless tobacco: Never Used  . Alcohol use No    Review of Systems Constitutional: No fever/chills Eyes: No visual changes. ENT: No sore throat. No stiff neck no neck pain Cardiovascular: Denies chest pain. Respiratory: Denies shortness of  breath. Gastrointestinal:   no vomiting.  No diarrhea.  No constipation. Genitourinary: Negative for dysuria. Musculoskeletal: Negative lower extremity swelling Skin: Negative for rash. Neurological: Negative for severe headaches, focal weakness or numbness.   ____________________________________________   PHYSICAL EXAM:  VITAL SIGNS: ED Triage Vitals  Enc Vitals Group     BP 07/03/16 1925 (!) 149/87     Pulse Rate 07/03/16 1925 (!) 103     Resp 07/03/16 1925 16     Temp --      Temp src --      SpO2 07/03/16 1925 95 %     Weight 07/03/16 1926 140 lb (63.5 kg)     Height --      Head Circumference --      Peak Flow --      Pain Score 07/03/16 1925 0     Pain Loc --      Pain Edu? --      Excl. in GC? --     Constitutional: Alert and oriented. Well appearing and in no acute distress. Eyes: Conjunctivae are normal Head: Atraumatic HEENT: No congestion/rhinnorhea. Mucous membranes are moist.  Oropharynx non-erythematous Neck:   Nontender with no meningismus, no masses, no stridor Cardiovascular: Normal rate, regular rhythm. Grossly normal heart sounds.  Good peripheral circulation. Respiratory: Normal respiratory effort.  No retractions. Lungs CTAB. Abdominal: Soft and nontender. No distention. No guarding no rebound Back:  There is no focal tenderness or step off.  there is no midline tenderness there are no lesions noted. there is no CVA tenderness Musculoskeletal:  No lower extremity tenderness, no upper extremity tenderness. No joint effusions, no DVT signs strong distal pulses no edema Neurologic:  Normal speech and language. No gross focal neurologic deficits are appreciated.  Skin:  Skin is warm, dry and intact. No rash noted. Psychiatric: Mood and affect are normal. Speech and behavior are normal.  ____________________________________________   LABS (all labs ordered are listed, but only abnormal results are displayed)  Labs Reviewed  GLUCOSE, CAPILLARY -  Abnormal; Notable for the following:       Result Value   Glucose-Capillary 127 (*)    All other components within normal limits  CBC  BASIC METABOLIC PANEL  TROPONIN I  URINE DRUG SCREEN, QUALITATIVE ([facility] ONLY)  URINALYSIS, COMPLETE ([facility]) WITH MICROSCOPIC   ____________________________________________  EKG  I personally interpreted any EKGs ordered by me or triage Patient here with EKG showing 99 bpm sinus rate normal axis no acute ischemic changes long PR ____________________________________________  RADIOLOGY  I reviewed any imaging ordered by me or triage that were performed during my shift and, if possible, patient and/or family made aware of any abnormal findings. ____________________________________________   PROCEDURES  Procedure(s) performed: None  Procedures  Critical Care performed: None  ____________________________________________   INITIAL IMPRESSION / ASSESSMENT AND PLAN / ED COURSE  Pertinent labs & imaging results that were available during my care of the patient were reviewed by me and considered in my medical decision making (see chart for details).  Patient with reported seizure at home. Likely has had them in the past without diagnosis. While I was in a intubation patient she did have another witnessed grand mal seizure. My partner did give her 2 of Ativan and she broke. This is 2 seizures today. CT of the head is reassuring. Likely has an undiagnosed seizure disorder. I'm loading her with Keppra. Patient will require admission for multiple seizures and presyncopal activity today.  ----------------------------------------- 11:33 PM on 07/03/2016 -----------------------------------------  Before admission, patient was somnolent but arousable and nonfocal.    ____________________________________________   FINAL CLINICAL IMPRESSION(S) / ED DIAGNOSES  Final diagnoses:  None      This chart was dictated using voice recognition software.   Despite best efforts to proofread,  errors can occur which can change meaning.      Jeanmarie PlantMcShane, Kashon Kraynak A, MD 07/03/16 2113    Jeanmarie PlantMcShane, Caresse Sedivy A, MD 07/03/16 [phone]

## 2016-07-03 NOTE — ED Provider Notes (Signed)
[facility] Emergency Department Provider Note  ____________________________________________   First MD Initiated Contact with Patient 07/03/16 1208     (approximate)  I have reviewed the triage vital signs and the nursing notes.   HISTORY  Chief Complaint Near Syncope   HPI Holly Whitney is a 56 y.o. female who comes to the emergency department via EMS after a near syncopal episode. She was in a parked car with the windows rolled down in the engine off with her husband ran into a store to run some errands. She began to sweat feel lightheaded in the car and she got out of the car and stood next to it. When her husband came back he noted that she was diaphoretic and ill appearing state he called 911. EMS noted sinus tachycardia with a blood sugar of 156 en route and normal blood pressure. The patient denies taking any medications. She denies past medical history on but she does not see doctors. She denies chest pain shortness of breath abdominal pain nausea or vomiting. She denies headache. She denies actually passing out. She said she began to feel hot warm and uncomfortable.   History reviewed. No pertinent past medical history.  Patient Active Problem List   Diagnosis Date Noted  . New onset seizure (HCC) 07/04/2016  . Seizure disorder [facility])     History reviewed. No pertinent surgical history.  Prior to Admission medications   Medication Sig Start Date End Date Taking? Authorizing Provider  ondansetron (ZOFRAN) 4 MG tablet Take 1 tablet (4 mg total) by mouth daily as needed for nausea or vomiting. Patient not taking: Reported on 07/03/2016 04/11/15   Emily Filbert, MD    Allergies Patient has no known allergies.  No family history on file.  Social History Social History  Substance Use Topics  . Smoking status: Former Smoker    Types: Cigarettes    Quit date: 04/10/2009  . Smokeless tobacco: Never Used  . Alcohol use No    Review of  Systems Constitutional: No fever/chills Eyes: No visual changes. ENT: No sore throat. Cardiovascular: Denies chest pain. Respiratory: Denies shortness of breath. Gastrointestinal: No abdominal pain.  No nausea, no vomiting.  No diarrhea.  No constipation. Genitourinary: Negative for dysuria. Musculoskeletal: Negative for back pain. Skin: Negative for rash. Neurological: Negative for headaches, focal weakness or numbness.   ____________________________________________   PHYSICAL EXAM:  VITAL SIGNS: ED Triage Vitals  Enc Vitals Group     BP      Pulse      Resp      Temp      Temp src      SpO2      Weight      Height      Head Circumference      Peak Flow      Pain Score      Pain Loc      Pain Edu?      Excl. in GC?     Constitutional: Alert and oriented 4 very well-appearing laughing joking pleasant cooperative speaks in full clear sentences Eyes: PERRL EOMI. Head: Atraumatic. Nose: No congestion/rhinnorhea. Mouth/Throat: No trismus Neck: No stridor.   Cardiovascular: Tachycardic rate, regular rhythm. Grossly normal heart sounds.  Good peripheral circulation. Respiratory: Normal respiratory effort.  No retractions. Lungs CTAB and moving good air Gastrointestinal: Soft nondistended nontender no rebound or guarding no peritonitis Musculoskeletal: No lower extremity edema   Neurologic:  Normal speech and language. No gross  focal neurologic deficits are appreciated. Skin:  Skin is warm, dry and intact. No rash noted. Psychiatric: Mood and affect are normal. Speech and behavior are normal.    ____________________________________________   DIFFERENTIAL  He stroke, heat exhaustion, dehydration, cardiogenic syncope, vasovagal syncope ____________________________________________   LABS (all labs ordered are listed, but only abnormal results are displayed)  Labs Reviewed  BASIC METABOLIC PANEL - Abnormal; Notable for the following:       Result Value   CO2 21  (*)    Glucose, Bld 177 (*)    All other components within normal limits  HEPATIC FUNCTION PANEL - Abnormal; Notable for the following:    Total Protein 8.9 (*)    AST 193 (*)    ALT 114 (*)    Total Bilirubin 1.3 (*)    Indirect Bilirubin 1.0 (*)    All other components within normal limits  CBC WITH DIFFERENTIAL/PLATELET - Abnormal; Notable for the following:    RBC 3.49 (*)    MCV 101.2 (*)    MCH 34.6 (*)    RDW 15.7 (*)    Platelets 98 (*)    Lymphs Abs 0.4 (*)    All other components within normal limits  CK    Elevated transaminases are unchanged from previous high MCV concerning for alcohol abuse __________________________________________  EKG  ED ECG REPORT I, Merrily BrittleNeil Federico Maiorino, the attending physician, personally viewed and interpreted this ECG.  Date: 07/03/2016 Rate: 109 Rhythm: Sinus tachycardia QRS Axis: normal Intervals: normal ST/T Wave abnormalities: normal Narrative Interpretation: unremarkable  ____________________________________________  RADIOLOGY   ____________________________________________   PROCEDURES  Procedure(s) performed: no  Procedures  Critical Care performed: no  Observation: no ____________________________________________   INITIAL IMPRESSION / ASSESSMENT AND PLAN / ED COURSE  Pertinent labs & imaging results that were available during my care of the patient were reviewed by me and considered in my medical decision making (see chart for details).  On arrival the patient is tachycardic although relatively well-appearing. Her history is most consistent with heat exhaustion so I will treat her with a liter of fluid while labs including a CK are pending.    After a liter of fluid the patient feels significantly improved, however her resting heart rate is still about 105. She does have elevated transaminases and a high MCV which is concerning for alcohol abuse. I spoke with the patient with her family out of the room and she  reports only occasional alcohol use and denies abuse. Regardless the symptoms are chronic on review and do not warrant emergent evaluation. She does not have a primary care physician so I will [facility]. Give her one more liter of saline now.  The patient's tachycardia has resolved. She feels well and would like to go home. She is discharged home in improved condition.  ____________________________________________   FINAL CLINICAL IMPRESSION(S) / ED DIAGNOSES  Final diagnoses:  Transaminitis  Heat exhaustion, initial encounter      NEW MEDICATIONS STARTED DURING THIS VISIT:  Discharge Medication List as of 07/03/2016  2:24 PM       Note:  This document was prepared using Dragon voice recognition software and may include unintentional dictation errors.     Merrily Brittleifenbark, Kela Baccari, MD 07/04/16 [phone]

## 2016-07-03 NOTE — Discharge Instructions (Addendum)
Please make sure you remain well-hydrated and drink lots of clear fluids for the next 24 hours or so. Please establish care with the primary care physician and go for recheck in 2 days. Today you're blood work was not completely normal, and her liver enzymes were slightly elevated. It looks like this has been going on for more than a year, but it does need to be evaluated more extensively by your primary care physician.  Please return to the emergency department for any new or worsening symptoms such as if you pass out, developed chest pain, or for any other concerns whatsoever.  It was a pleasure to take care of you today, and thank you for coming to our emergency department.  If you have any questions or concerns before leaving please ask the nurse to grab me and I'm more than happy to go through your aftercare instructions again.  If you were prescribed any opioid pain medication today such as Norco, Vicodin, Percocet, morphine, hydrocodone, or oxycodone please make sure you do not drive when you are taking this medication as it can alter your ability to drive safely.  If you have any concerns once you are home that you are not improving or are in fact getting worse before you can make it to your follow-up appointment, please do not hesitate to call 911 and come back for further evaluation.  Merrily Brittle MD  Results for orders placed or performed during the hospital encounter of 07/03/16  Basic metabolic panel  Result Value Ref Range   Sodium 136 135 - 145 mmol/L   Potassium 4.0 3.5 - 5.1 mmol/L   Chloride 101 101 - 111 mmol/L   CO2 21 (L) 22 - 32 mmol/L   Glucose, Bld 177 (H) 65 - 99 mg/dL   BUN 13 6 - 20 mg/dL   Creatinine, Ser 1.61 0.44 - 1.00 mg/dL   Calcium 8.9 8.9 - 09.6 mg/dL   GFR calc non Af Amer >60 >60 mL/min   GFR calc Af Amer >60 >60 mL/min   Anion gap 14 5 - 15  Hepatic function panel  Result Value Ref Range   Total Protein 8.9 (H) 6.5 - 8.1 g/dL   Albumin 4.6 3.5 -  5.0 g/dL   AST 045 (H) 15 - 41 U/L   ALT 114 (H) 14 - 54 U/L   Alkaline Phosphatase 72 38 - 126 U/L   Total Bilirubin 1.3 (H) 0.3 - 1.2 mg/dL   Bilirubin, Direct 0.3 0.1 - 0.5 mg/dL   Indirect Bilirubin 1.0 (H) 0.3 - 0.9 mg/dL  CBC with Differential  Result Value Ref Range   WBC 4.2 3.6 - 11.0 K/uL   RBC 3.49 (L) 3.80 - 5.20 MIL/uL   Hemoglobin 12.1 12.0 - 16.0 g/dL   HCT 40.9 81.1 - 91.4 %   MCV 101.2 (H) 80.0 - 100.0 fL   MCH 34.6 (H) 26.0 - 34.0 pg   MCHC 34.2 32.0 - 36.0 g/dL   RDW 78.2 (H) 95.6 - 21.3 %   Platelets 98 (L) 150 - 440 K/uL   Neutrophils Relative % 81 %   Neutro Abs 3.4 1.4 - 6.5 K/uL   Lymphocytes Relative 10 %   Lymphs Abs 0.4 (L) 1.0 - 3.6 K/uL   Monocytes Relative 8 %   Monocytes Absolute 0.3 0.2 - 0.9 K/uL   Eosinophils Relative 0 %   Eosinophils Absolute 0.0 0 - 0.7 K/uL   Basophils Relative 1 %   Basophils Absolute  0.0 0 - 0.1 K/uL  CK  Result Value Ref Range   Total CK 94 38 - 234 U/L

## 2016-07-03 NOTE — H&P (Signed)
History and Physical   [facility] [place] [facility] Admission History and Physical [employer] Leathie Weich, D.O.    Patient Name: Holly Whitney Handel MR#: [id number] Date of Birth: 29-Jun-1960 Date of Admission: 07/03/2016  Referring MD/NP/PA: Dr. Alphonzo LemmingsMcShane Primary Care Physician: Patient, No Pcp Per   Chief Complaint:  Chief Complaint  Patient presents with  . Seizures  . Weakness  Please note the entire history is obtained from the patient's emergency department chart, emergency department providers. Patient's personal history is limited by sedation/postictal.  HPI: Holly Whitney Belgrave is a 56 y.o. female with no known history  presents to the emergency department for evaluation of Witnessed seizure.  Patient was in a usual state of health until earlier the day patient was seen and evaluated in the emergency department for lightheadedness thought to be related to being outside in the heat however upon her arrival home family witnessed generalized tonic-clonic seizure lasting approximately 1 minute with a post ictal period. There was no apparent tongue bite or incontinence. She sustained a second seizure with associated hypoxia in the emergency department for which she was given 2 mg of Ativan and placed on oxygen.   At present she is lethargic secondary to sedation.    EMS/ED Course: Patient received Keppra IV, Ativan.Medical admission was requested for further management of new onset seizures  Review of Systems:  Unable to obtain secondary to sedation.  History reviewed. No pertinent past medical history.  History reviewed. No pertinent surgical history.   reports that she quit smoking about 7 years ago. Her smoking use included Cigarettes. She has never used smokeless tobacco. She reports that she does not drink alcohol or use drugs.  No Known Allergies  History reviewed. No pertinent family history.  Prior to Admission medications   Medication Sig Start Date End Date Taking?  Authorizing Provider  ondansetron (ZOFRAN) 4 MG tablet Take 1 tablet (4 mg total) by mouth daily as needed for nausea or vomiting. Patient not taking: Reported on 07/03/2016 04/11/15   Emily FilbertWilliams, Jonathan E, MD    Physical Exam: Vitals:   07/03/16 1926 07/03/16 2100 07/03/16 2130 07/03/16 2200  BP:  (!) 146/92 (!) 144/89 (!) 148/84  Pulse:  (!) 102 95 99  Resp:  (!) 21 15 (!) 21  SpO2:  100% 100% 100%  Weight: 63.5 kg (140 lb)       GENERAL: 56 y.o.-year-old female patient, well-developed, well-nourished lying in the bed in no acute distress.  Sedated but arousable HEENT: Head atraumatic, normocephalic. Pupils equal, round, reactive to light and accommodation. No scleral icterus. Extraocular muscles intact. Nares are patent. Oropharynx is clear. Mucus membranes moist. NECK: Supple, full range of motion.  CHEST: Normal breath sounds bilaterally. No wheezing, rales, rhonchi or crackles. No use of accessory muscles of respiration.  No reproducible chest wall tenderness.  CARDIOVASCULAR: S1, S2 normal. No murmurs, rubs, or gallops. Cap refill <2 seconds. Pulses intact distally.  ABDOMEN: Soft, nondistended, nontender. No rebound, guarding, rigidity. Normoactive bowel sounds present in all four quadrants. No organomegaly or mass. EXTREMITIES: No pedal edema, cyanosis, or clubbing. No calf tenderness or Homan's sign.  NEUROLOGIC: The patient is unable to comply with exam due to sedation. SKIN: Warm, dry, and intact without obvious rash, lesion, or ulcer.   Labs on Admission:  CBC:  Recent Labs Lab 07/03/16 1206 07/03/16 1931  WBC 4.2 6.8  NEUTROABS 3.4  --   HGB 12.1 12.0  HCT 35.4 35.0  MCV 101.2* 99.1  PLT 98*  90*   Basic Metabolic Panel:  Recent Labs Lab 07/03/16 1206 07/03/16 1931  NA 136 136  K 4.0 3.1*  CL 101 102  CO2 21* 22  GLUCOSE 177* 142*  BUN 13 9  CREATININE 0.71 0.67  CALCIUM 8.9 8.8*   GFR: CrCl cannot be calculated (Unknown ideal weight.). Liver  Function Tests:  Recent Labs Lab 07/03/16 1206  AST 193*  ALT 114*  ALKPHOS 72  BILITOT 1.3*  PROT 8.9*  ALBUMIN 4.6   No results for input(s): LIPASE, AMYLASE in the last 168 hours. No results for input(s): AMMONIA in the last 168 hours. Coagulation Profile: No results for input(s): INR, PROTIME in the last 168 hours. Cardiac Enzymes:  Recent Labs Lab 07/03/16 1206  CKTOTAL 94   BNP (last 3 results) No results for input(s): PROBNP in the last 8760 hours. HbA1C: No results for input(s): HGBA1C in the last 72 hours. CBG:  Recent Labs Lab 07/03/16 2002  GLUCAP 127*   Lipid Profile: No results for input(s): CHOL, HDL, LDLCALC, TRIG, CHOLHDL, LDLDIRECT in the last 72 hours. Thyroid Function Tests: No results for input(s): TSH, T4TOTAL, FREET4, T3FREE, THYROIDAB in the last 72 hours. Anemia Panel: No results for input(s): VITAMINB12, FOLATE, FERRITIN, TIBC, IRON, RETICCTPCT in the last 72 hours. Urine analysis:    Component Value Date/Time   COLORURINE YELLOW 07/03/2016 2044   APPEARANCEUR HAZY (A) 07/03/2016 2044   APPEARANCEUR Clear 08/10/2012 1700   LABSPEC 1.020 07/03/2016 2044   LABSPEC 1.039 08/10/2012 1700   PHURINE 5.0 07/03/2016 2044   GLUCOSEU NEGATIVE 07/03/2016 2044   GLUCOSEU Negative 08/10/2012 1700   HGBUR TRACE (A) 07/03/2016 2044   BILIRUBINUR NEGATIVE 07/03/2016 2044   BILIRUBINUR Negative 08/10/2012 1700   KETONESUR NEGATIVE 07/03/2016 2044   PROTEINUR 30 (A) 07/03/2016 2044   NITRITE NEGATIVE 07/03/2016 2044   LEUKOCYTESUR TRACE (A) 07/03/2016 2044   LEUKOCYTESUR Trace 08/10/2012 1700   Sepsis Labs: @LABRCNTIP (procalcitonin:4,lacticidven:4) )No results found for this or any previous visit (from the past 240 hour(s)).   Radiological Exams on Admission: Ct Head Wo Contrast  Result Date: 07/03/2016 CLINICAL DATA:  Syncope, fatigue.  Seizure-like activity. EXAM: CT HEAD WITHOUT CONTRAST TECHNIQUE: Contiguous axial images were obtained from  the base of the skull through the vertex without intravenous contrast. COMPARISON:  None. FINDINGS: Brain: No acute intracranial abnormality. Specifically, no hemorrhage, hydrocephalus, mass lesion, acute infarction, or significant intracranial injury. Vascular: No hyperdense vessel or unexpected calcification. Skull: No acute calvarial abnormality. Sinuses/Orbits: Mucosal thickening in the right frontal sinus. Remainder the paranasal sinuses and mastoid air cells are clear. Orbital soft tissues unremarkable. Other: None IMPRESSION: No acute intracranial abnormality. Chronic right frontal sinusitis. Electronically Signed   By: Charlett Nose M.D.   On: 07/03/2016 20:33    EKG: Normal sinus rhythm at 99 bpm with normal axis and nonspecific ST-T wave changes.   Assessment/Plan  This is a 56 y.o. female with no medical history now being admitted with:  #. New-onset generalized tonic clonic seizure -Admit inpatient -IV Keppra, PRN Ativan -Check labs, TSH - Neurochecks -Neurology consult possible EEG  #. Hypokalemia, mild -Replace by vein and recheck BMP in a.m.  #. Transaminitis, mild. Unclear etiology - Recheck CMP in AM.  Admission status: Inpatient IV Fluids: Normal saline Diet/Nutrition: Nothing by mouth overnight Consults called: Neurology  DVT Px: Lovenox, SCDs and early ambulation. Code Status: Full Code  Disposition Plan: To home in 1-2 days  All the records are reviewed and case discussed  with ED provider. Management plans discussed with the patient and/or family who express understanding and agree with plan of care.  Lulubelle Simcoe D.O. on 07/03/2016 at 10:50 PM Between 7am to 6pm - Pager - [phone] After 6pm go to [email or web address] - password [email or web address] [facility] [place] [facility] Office [phone] CC: Primary care physician; Patient, No Pcp Per   07/03/2016, 10:50 PM

## 2016-07-03 NOTE — ED Provider Notes (Signed)
Dr. Alphonzo LemmingsMcShane was occupied intubating a patient and asked me to come evaluate the patient. She is having a generalized tonic-clonic seizure with eyes deviated to the left. She desatted down to the high 50s. I provided airway manipulation and a jaw thrust as well as suction and nasal cannula oxygen which perked her up to the 90s. We then gave 2 mg of intravenous Ativan in her seizure broke.   Merrily Brittleifenbark, Oliver Heitzenrater, MD 07/03/16 2048

## 2016-07-03 NOTE — ED Triage Notes (Signed)
Pt coming from home via EMS. Patient was seen here this morning for fatigue and syncopal episode this morning. Pt was d/c around noon and family states that she had seizure like activity this afternoon and patient is weak and starting vomiting in EMS truck. Pt denies any pain. Patient is alert and oriented but lethargic and states she just doesn't feel herself.

## 2016-07-03 NOTE — ED Notes (Addendum)
  Ivar DrapeWillie (husband) [phone]

## 2016-07-03 NOTE — ED Triage Notes (Addendum)
Pt to ED via EMS from home, was in car and had witnessed syncopal episode. Pt states she became hot, denies CP or SOB. Pt A&Ox4, cbg 152 per EMS, VS stable. Pt received 500 cc fluid bolus en route , 18g lft AC placed by aems

## 2016-07-03 NOTE — ED Notes (Signed)
Nurse called into room for patient seizing. Dr. Lamont Snowballifenbark present in room. Pt's was placed on 4L , suctioned, and airway maintained. Verbal order for 2mg  of Ativan given.

## 2016-07-04 ENCOUNTER — Inpatient Hospital Stay: Payer: Self-pay

## 2016-07-04 ENCOUNTER — Inpatient Hospital Stay (HOSPITAL_COMMUNITY): Payer: Self-pay

## 2016-07-04 DIAGNOSIS — R569 Unspecified convulsions: Secondary | ICD-10-CM

## 2016-07-04 DIAGNOSIS — G40909 Epilepsy, unspecified, not intractable, without status epilepticus: Secondary | ICD-10-CM

## 2016-07-04 LAB — CBC
HEMATOCRIT: 36.1 % (ref 35.0–47.0)
Hemoglobin: 12.7 g/dL (ref 12.0–16.0)
MCH: 35 pg — ABNORMAL HIGH (ref 26.0–34.0)
MCHC: 35.3 g/dL (ref 32.0–36.0)
MCV: 99.1 fL (ref 80.0–100.0)
Platelets: 74 10*3/uL — ABNORMAL LOW (ref 150–440)
RBC: 3.64 MIL/uL — ABNORMAL LOW (ref 3.80–5.20)
RDW: 15.3 % — ABNORMAL HIGH (ref 11.5–14.5)
WBC: 7.7 10*3/uL (ref 3.6–11.0)

## 2016-07-04 LAB — BASIC METABOLIC PANEL
ANION GAP: 7 (ref 5–15)
BUN: 6 mg/dL (ref 6–20)
CALCIUM: 8.9 mg/dL (ref 8.9–10.3)
CO2: 28 mmol/L (ref 22–32)
Chloride: 100 mmol/L — ABNORMAL LOW (ref 101–111)
Creatinine, Ser: 0.5 mg/dL (ref 0.44–1.00)
Glucose, Bld: 102 mg/dL — ABNORMAL HIGH (ref 65–99)
POTASSIUM: 3.2 mmol/L — AB (ref 3.5–5.1)
SODIUM: 135 mmol/L (ref 135–145)

## 2016-07-04 LAB — TROPONIN I: Troponin I: <0.03 ng/mL (ref <0.03)

## 2016-07-04 LAB — PHOSPHORUS: PHOSPHORUS: 3.4 mg/dL (ref 2.5–4.6)

## 2016-07-04 LAB — TSH: TSH: 2.823 u[IU]/mL (ref 0.350–4.500)

## 2016-07-04 LAB — MAGNESIUM
MAGNESIUM: 1.4 mg/dL — AB (ref 1.7–2.4)
MAGNESIUM: 1.8 mg/dL (ref 1.7–2.4)

## 2016-07-04 LAB — POTASSIUM: POTASSIUM: 3.6 mmol/L (ref 3.5–5.1)

## 2016-07-04 MED ORDER — POTASSIUM CHLORIDE 10 MEQ/100ML IV SOLN
10.0000 meq | Freq: Once | INTRAVENOUS | Status: AC
  Administered 2016-07-04: 10 meq via INTRAVENOUS
  Filled 2016-07-04: qty 100

## 2016-07-04 MED ORDER — POTASSIUM CHLORIDE 10 MEQ/100ML IV SOLN
10.0000 meq | INTRAVENOUS | Status: AC
  Administered 2016-07-04 (×3): 10 meq via INTRAVENOUS
  Filled 2016-07-04 (×4): qty 100

## 2016-07-04 MED ORDER — ACETAMINOPHEN 650 MG RE SUPP: 650.0000 mg | Freq: Four times a day (QID) | RECTAL | Status: DC | PRN

## 2016-07-04 MED ORDER — SODIUM CHLORIDE 0.9 % IV SOLN
500.0000 mg | Freq: Two times a day (BID) | INTRAVENOUS | Status: DC
  Administered 2016-07-04 – 2016-07-05 (×3): 500 mg via INTRAVENOUS
  Filled 2016-07-04 (×6): qty 5

## 2016-07-04 MED ORDER — LORAZEPAM 1 MG PO TABS: 1.0000 mg | ORAL_TABLET | Freq: Four times a day (QID) | ORAL | Status: DC | PRN

## 2016-07-04 MED ORDER — BOOST / RESOURCE BREEZE PO LIQD
1.0000 | Freq: Three times a day (TID) | ORAL | Status: DC
  Administered 2016-07-04 – 2016-07-05 (×2): 1 via ORAL

## 2016-07-04 MED ORDER — ALBUTEROL SULFATE (2.5 MG/3ML) 0.083% IN NEBU: 2.5000 mg | INHALATION_SOLUTION | Freq: Four times a day (QID) | RESPIRATORY_TRACT | Status: DC | PRN

## 2016-07-04 MED ORDER — FOLIC ACID 1 MG PO TABS
1.0000 mg | ORAL_TABLET | Freq: Every day | ORAL | Status: DC
  Administered 2016-07-04 – 2016-07-05 (×2): 1 mg via ORAL
  Filled 2016-07-04 (×2): qty 1

## 2016-07-04 MED ORDER — ACETAMINOPHEN 325 MG PO TABS: 650.0000 mg | ORAL_TABLET | Freq: Four times a day (QID) | ORAL | Status: DC | PRN

## 2016-07-04 MED ORDER — ONDANSETRON HCL 4 MG PO TABS: 4.0000 mg | ORAL_TABLET | Freq: Four times a day (QID) | ORAL | Status: DC | PRN

## 2016-07-04 MED ORDER — MAGNESIUM CITRATE PO SOLN: 1.0000 | Freq: Once | ORAL | Status: DC | PRN

## 2016-07-04 MED ORDER — THIAMINE HCL 100 MG/ML IJ SOLN: 100.0000 mg | Freq: Every day | INTRAMUSCULAR | Status: DC

## 2016-07-04 MED ORDER — IPRATROPIUM BROMIDE 0.02 % IN SOLN
0.5000 mg | Freq: Four times a day (QID) | RESPIRATORY_TRACT | Status: DC | PRN
Start: 2016-07-04 — End: 2016-07-05

## 2016-07-04 MED ORDER — SODIUM CHLORIDE 0.9 % IV SOLN
INTRAVENOUS | Status: DC
  Administered 2016-07-04 – 2016-07-05 (×3): via INTRAVENOUS

## 2016-07-04 MED ORDER — POTASSIUM CHLORIDE 10 MEQ/100ML IV SOLN
10.0000 meq | INTRAVENOUS | Status: AC
  Administered 2016-07-04 (×3): 10 meq via INTRAVENOUS
  Filled 2016-07-04 (×6): qty 100

## 2016-07-04 MED ORDER — ENOXAPARIN SODIUM 40 MG/0.4ML ~~LOC~~ SOLN: 40.0000 mg | SUBCUTANEOUS | Status: DC

## 2016-07-04 MED ORDER — SENNOSIDES-DOCUSATE SODIUM 8.6-50 MG PO TABS: 1.0000 | ORAL_TABLET | Freq: Every evening | ORAL | Status: DC | PRN

## 2016-07-04 MED ORDER — BISACODYL 5 MG PO TBEC
5.0000 mg | DELAYED_RELEASE_TABLET | Freq: Every day | ORAL | Status: DC | PRN
  Filled 2016-07-04: qty 1

## 2016-07-04 MED ORDER — LORAZEPAM 2 MG/ML IJ SOLN: 1.0000 mg | Freq: Four times a day (QID) | INTRAMUSCULAR | Status: DC | PRN

## 2016-07-04 MED ORDER — GADOBENATE DIMEGLUMINE 529 MG/ML IV SOLN
10.0000 mL | Freq: Once | INTRAVENOUS | Status: AC | PRN
  Administered 2016-07-04: 22:00:00 10 mL via INTRAVENOUS

## 2016-07-04 MED ORDER — VITAMIN B-1 100 MG PO TABS
100.0000 mg | ORAL_TABLET | Freq: Every day | ORAL | Status: DC
  Administered 2016-07-04 – 2016-07-05 (×2): 100 mg via ORAL
  Filled 2016-07-04 (×2): qty 1

## 2016-07-04 MED ORDER — ONDANSETRON HCL 4 MG/2ML IJ SOLN
4.0000 mg | Freq: Four times a day (QID) | INTRAMUSCULAR | Status: DC | PRN
  Administered 2016-07-04: 4 mg via INTRAVENOUS
  Filled 2016-07-04: qty 2

## 2016-07-04 MED ORDER — OXYCODONE HCL 5 MG PO TABS: 5.0000 mg | ORAL_TABLET | ORAL | Status: DC | PRN

## 2016-07-04 MED ORDER — ADULT MULTIVITAMIN W/MINERALS CH
1.0000 | ORAL_TABLET | Freq: Every day | ORAL | Status: DC
  Administered 2016-07-04 – 2016-07-05 (×2): 1 via ORAL
  Filled 2016-07-04 (×2): qty 1

## 2016-07-04 NOTE — Progress Notes (Signed)
[facility] [place] [facility]   PATIENT NAME: Holly MarkerCarolyn Whitney    MR#:  [id number]  DATE OF BIRTH:  [birth year]  SUBJECTIVE:  CHIEF COMPLAINT:  No other episodes of seizures. Patient admits drinking too much alcohol  REVIEW OF SYSTEMS:  CONSTITUTIONAL: No fever, fatigue or weakness.  EYES: No blurred or double vision.  EARS, NOSE, AND THROAT: No tinnitus or ear pain.  RESPIRATORY: No cough, shortness of breath, wheezing or hemoptysis.  CARDIOVASCULAR: No chest pain, orthopnea, edema.  GASTROINTESTINAL: No nausea, vomiting, diarrhea or abdominal pain.  GENITOURINARY: No dysuria, hematuria.  ENDOCRINE: No polyuria, nocturia,  HEMATOLOGY: No anemia, easy bruising or bleeding SKIN: No rash or lesion. MUSCULOSKELETAL: No joint pain or arthritis.   NEUROLOGIC: No tingling, numbness, weakness.  PSYCHIATRY: No anxiety or depression.   DRUG ALLERGIES:  No Known Allergies  VITALS:  Blood pressure 130/81, pulse 87, temperature 98.5 F (36.9 C), temperature source Oral, resp. rate 18, height 5\' 2"  (1.575 m), weight 61.6 kg (135 lb 11.2 oz), SpO2 97 %.  PHYSICAL EXAMINATION:  GENERAL:  56 y.o.-year-old patient lying in the bed with no acute distress.  EYES: Pupils equal, round, reactive to light and accommodation. No scleral icterus. Extraocular muscles intact.  HEENT: Head atraumatic, normocephalic. Oropharynx and nasopharynx clear.  NECK:  Supple, no jugular venous distention. No thyroid enlargement, no tenderness.  LUNGS: Normal breath sounds bilaterally, no wheezing, rales,rhonchi or crepitation. No use of accessory muscles of respiration.  CARDIOVASCULAR: S1, S2 normal. No murmurs, rubs, or gallops.  ABDOMEN: Soft, nontender, nondistended. Bowel sounds present. No organomegaly or mass.  EXTREMITIES: No pedal edema, cyanosis, or clubbing.  NEUROLOGIC: Cranial nerves II through XII are intact. Muscle strength 5/5 in all extremities. Sensation intact. Gait not  checked.  PSYCHIATRIC: The patient is alert and oriented x 3.  SKIN: No obvious rash, lesion, or ulcer.    LABORATORY PANEL:   CBC  Recent Labs Lab 07/04/16 0255  WBC 7.7  HGB 12.7  HCT 36.1  PLT 74*   ------------------------------------------------------------------------------------------------------------------  Chemistries   Recent Labs Lab 07/03/16 1206  07/04/16 0255  NA 136  < > 135  K 4.0  < > 3.2*  CL 101  < > 100*  CO2 21*  < > 28  GLUCOSE 177*  < > 102*  BUN 13  < > 6  CREATININE 0.71  < > 0.50  CALCIUM 8.9  < > 8.9  MG 1.4*  --  1.8  AST 193*  --   --   ALT 114*  --   --   ALKPHOS 72  --   --   BILITOT 1.3*  --   --   < > = values in this interval not displayed. ------------------------------------------------------------------------------------------------------------------  Cardiac Enzymes  Recent Labs Lab 07/04/16 0255  TROPONINI <0.03   ------------------------------------------------------------------------------------------------------------------  RADIOLOGY:  Ct Head Wo Contrast  Result Date: 07/03/2016 CLINICAL DATA:  Syncope, fatigue.  Seizure-like activity. EXAM: CT HEAD WITHOUT CONTRAST TECHNIQUE: Contiguous axial images were obtained from the base of the skull through the vertex without intravenous contrast. COMPARISON:  None. FINDINGS: Brain: No acute intracranial abnormality. Specifically, no hemorrhage, hydrocephalus, mass lesion, acute infarction, or significant intracranial injury. Vascular: No hyperdense vessel or unexpected calcification. Skull: No acute calvarial abnormality. Sinuses/Orbits: Mucosal thickening in the right frontal sinus. Remainder the paranasal sinuses and mastoid air cells are clear. Orbital soft tissues unremarkable. Other: None IMPRESSION: No acute intracranial abnormality. Chronic right frontal sinusitis. Electronically Signed  By: Charlett Nose M.D.   On: 07/03/2016 20:33    EKG:   Orders placed or  performed during the hospital encounter of 07/03/16  . EKG 12-Lead    ASSESSMENT AND PLAN:   #. New-onset generalized tonic clonic seizure/could be from alcoholic withdrawal  -EEG -IV Keppra, PRN Ativan -Check labs, TSH - Neurochecks -Neurology consult   #. Hypokalemia, mild -Replace and recheck BMP in a.m. -Pending lesion measured 1.8  #. Transaminitis, mild. from alcohol use - Recheck CMP in AM. If  #Thrombocytopenia Discontinue Lovenox platelet count is at 74,000 Repeat CBC in a.m.  #Alcohol abuse disorder CIWA Counseled patient to stop drinking alcohol Outpatient alcoholic anonymous follow-up     All the records are reviewed and case discussed with Care Management/Social Workerr. Management plans discussed with the patient, family and they are in agreement.  CODE STATUS: fc  TOTAL TIME TAKING CARE OF THIS PATIENT: 36 minutes.   POSSIBLE D/C IN 1-2  DAYS, DEPENDING ON CLINICAL CONDITION.  Note: This dictation was prepared with Dragon dictation along with smaller phrase technology. Any transcriptional errors that result from this process are unintentional.   Ramonita Lab M.D on 07/04/2016 at 2:07 PM  Between 7am to 6pm - Pager - [phone] After 6pm go to [email or web address] - password [email or web address]  [facility]  Office  [phone]  CC: Primary care physician; Patient, No Pcp Per

## 2016-07-04 NOTE — Progress Notes (Signed)
Initial Nutrition Assessment  DOCUMENTATION CODES:   Non-severe (moderate) malnutrition in context of social or environmental circumstances  INTERVENTION:  Provide Boost Breeze po TID, each supplement provides 250 kcal and 9 grams of protein.   Encouraged adequate intake of calories and protein at meals.  Patient reporting she drinks 4-5 beers per day. Recommend ordering daily thiamine, folate, and multivitamin with minerals. Due to risk for Wernicke's Encephalopathy, recommend providing IV thiamine during admission as GI absorption of PO thiamine will be limited in setting of EtOH abuse.   NUTRITION DIAGNOSIS:   Malnutrition (Moderate) related to social / environmental circumstances (EtOH abuse) as evidenced by energy intake < 75% for > or equal to 3 months, mild depletion of body fat, moderate depletion of body fat, mild depletion of muscle mass, moderate depletions of muscle mass.  GOAL:   Patient will meet greater than or equal to 90% of their needs  MONITOR:   PO intake, Supplement acceptance, Labs, Weight trends, I & O's  REASON FOR ASSESSMENT:   Malnutrition Screening Tool    ASSESSMENT:   56 year old female with no known PMHx presented after witnessed seizure and later sustained another seizure with hypoxia in the ED. Admitted with new-onset generalized tonic clinic seizure.   Spoke with patient at bedside. She reports she has had a poor appetite for a few months now. Patient reports she has been drinking the past few months and that when she drinks she does not usually eat well or drink water. She reports drinking 4-5 beers per day. Reports drinking at least every other day, but it has been worse the past few months. Last alcoholic beverage was Monday 6/18. Patient reports that on the days she drinks she usually does not eat any food that day. The next day she will get very hungry and may have two meals. Reports for breakfast she has country ham, sausage, and toast. For  dinner she may have pork chops or chicken with peas and corn. This is a daily intake of approximately 1028 kcal per day (67% minimum estimated kcal needs) on the days she does eat. Patient reports she does not like milk or Ensure. She is amenable to drinking Boost Breeze. Does not take any vitamins.  Reports her UBW is 165-170 lbs and that she has lost approximately 100 lbs over the past year, which does not add up. RD obtained bed scale weight of 135.7 lbs. She has lost approximately 30 lbs (18% body weight) from her reported UBW. Per chart patient was 150 lbs on 12/25/2015 and has lost 14.3 lbs (9.5% body weight) over the past 6 months, which is not significant for time frame.  Medications reviewed and include: NS @ 75 ml/hr, Keppra, potassium chloride 10 mEq every hour x 4 IV, Zofran PRN.  Labs reviewed: Potassium 3.2, Chloride 100. Magnesium and Phosphorus WNL.  Nutrition-Focused physical exam completed. Findings are mild-moderate fat depletion, mild-moderate muscle depletion, and no edema.   Discussed with RN.  Diet Order:  DIET SOFT Room service appropriate? Yes; Fluid consistency: Thin  Skin:  Reviewed, no issues  Last BM:  07/03/2016 - per pt report  Height:   Ht Readings from Last 1 Encounters:  07/04/16 5\' 2"  (1.575 m)    Weight:   Wt Readings from Last 1 Encounters:  07/04/16 135 lb 11.2 oz (61.6 kg)    Ideal Body Weight:  50 kg  BMI:  Body mass index is 24.82 kg/m.  Estimated Nutritional Needs:   Kcal:  1540-1850 (25-30 kcal/kg)  Protein:  90-105 grams (1.5-1.7 grams/kg)  Fluid:  1.8 L/day (30 ml/kg)  EDUCATION NEEDS:   No education needs identified at this time  Helane RimaLeanne Joren Rehm, MS, RD, LDN Pager: [phone] After Hours Pager: [phone]

## 2016-07-04 NOTE — ED Notes (Signed)
Pt got up to urinate with the help of staff. Patient oriented x4.

## 2016-07-04 NOTE — Consult Note (Signed)
Reason for Consult:Seizures Referring Physician: Gouru  CC: Seizures  HPI: Holly Whitney is an 56 y.o. female with a history of seizures but no past history of seizures who was admitted with seizures.  Patient amnestic of the events.  All history obtained from the chart.  The patient was in a usual state of health until yesterday when the patient was seen and evaluated in the emergency department for lightheadedness thought to be related to being outside in the heat.   However upon her arrival home family witnessed generalized tonic-clonic seizure lasting approximately 1 minute with a post ictal period. There was no apparent tongue bite or incontinence. She sustained a second seizure with associated hypoxia in the emergency department for which she was given 2 mg of Ativan and placed on oxygen. Patient with a history of ETOH drinking everyday but reports due to not feeling well has not had anything to drink in the past 2-3 days,    History reviewed. No pertinent past medical history.  History reviewed. No pertinent surgical history.  Family history: No family history seizures  Social History:  reports that she quit smoking about 7 years ago. Her smoking use included Cigarettes. She has never used smokeless tobacco. She reports daily ETOH use.  She reports no history of illicit drug abuse.   No Known Allergies  Medications:  I have reviewed the patient's current medications. Prior to Admission:  Prescriptions Prior to Admission  Medication Sig Dispense Refill Last Dose  . ondansetron (ZOFRAN) 4 MG tablet Take 1 tablet (4 mg total) by mouth daily as needed for nausea or vomiting. (Patient not taking: Reported on 07/03/2016) 20 tablet 1 Not Taking at Unknown time   Scheduled: . feeding supplement  1 Container Oral TID BM  . folic acid  1 mg Oral Daily  . multivitamin with minerals  1 tablet Oral Daily  . thiamine  100 mg Oral Daily   Or  . thiamine  100 mg Intravenous Daily     ROS: History obtained from the patient  General ROS: negative for - chills, fatigue, fever, night sweats, weight gain or weight loss Psychological ROS: negative for - behavioral disorder, hallucinations, memory difficulties, mood swings or suicidal ideation Ophthalmic ROS: negative for - blurry vision, double vision, eye pain or loss of vision ENT ROS: negative for - epistaxis, nasal discharge, oral lesions, sore throat, tinnitus Allergy and Immunology ROS: negative for - hives or itchy/watery eyes Hematological and Lymphatic ROS: negative for - bleeding problems, bruising or swollen lymph nodes Endocrine ROS: negative for - galactorrhea, hair pattern changes, polydipsia/polyuria or temperature intolerance Respiratory ROS: negative for - cough, hemoptysis, shortness of breath or wheezing Cardiovascular ROS: negative for - chest pain, dyspnea on exertion, edema or irregular heartbeat Gastrointestinal ROS: negative for - abdominal pain, diarrhea, hematemesis, nausea/vomiting or stool incontinence Genito-Urinary ROS: negative for - dysuria, hematuria, incontinence or urinary frequency/urgency Musculoskeletal ROS: negative for - joint swelling or muscular weakness Neurological ROS: as noted in HPI Dermatological ROS: negative for rash and skin lesion changes  Physical Examination: Blood pressure 140/80, pulse 90, temperature 98.9 F (37.2 C), temperature source Oral, resp. rate 18, height 5\' 2"  (1.575 m), weight 60.3 kg (133 lb), SpO2 98 %.  HEENT-  Normocephalic, no lesions, without obvious abnormality.  Normal external eye and conjunctiva.  Normal TM's bilaterally.  Normal auditory canals and external ears. Normal external nose, mucus membranes and septum.  Normal pharynx. Cardiovascular- S1, S2 normal, pulses palpable throughout  Lungs- chest clear, no wheezing, rales, normal symmetric air entry Abdomen- soft, non-tender; bowel sounds normal; no masses,  no organomegaly Extremities-  no edema Lymph-no adenopathy palpable Musculoskeletal-no joint tenderness, deformity or swelling Skin-warm and dry, no hyperpigmentation, vitiligo, or suspicious lesions  Neurological Examination   Mental Status: Alert, oriented, thought content appropriate.  Speech fluent without evidence of aphasia.  Able to follow 3 step commands without difficulty. Cranial Nerves: II: Discs flat bilaterally; Visual fields grossly normal, pupils equal, round, reactive to light and accommodation III,IV, VI: ptosis not present, extra-ocular motions intact bilaterally V,VII: smile symmetric, facial light touch sensation normal bilaterally VIII: hearing normal bilaterally IX,X: gag reflex present XI: bilateral shoulder shrug XII: midline tongue extension Motor: Right : Upper extremity   5/5    Left:     Upper extremity   5/5  Lower extremity   5/5     Lower extremity   5/5 Tone and bulk:normal tone throughout; no atrophy noted.  Patient tremulous throughout. Sensory: Pinprick and light touch intact throughout, bilaterally Deep Tendon Reflexes: 2+ and symmetric with absent AJ's bilaterally Plantars: Right: downgoing   Left: downgoing Cerebellar: Normal finger-to-nose and normal heel-to-shin testing bilaterally Gait: not tested due to safety concerns    Laboratory Studies:   Basic Metabolic Panel:  Recent Labs Lab 07/03/16 1206 07/03/16 1931 07/04/16 0255  NA 136 136 135  K 4.0 3.1* 3.2*  CL 101 102 100*  CO2 21* 22 28  GLUCOSE 177* 142* 102*  BUN 13 9 6   CREATININE 0.71 0.67 0.50  CALCIUM 8.9 8.8* 8.9  MG 1.4*  --  1.8  PHOS 3.4  --   --     Liver Function Tests:  Recent Labs Lab 07/03/16 1206  AST 193*  ALT 114*  ALKPHOS 72  BILITOT 1.3*  PROT 8.9*  ALBUMIN 4.6   No results for input(s): LIPASE, AMYLASE in the last 168 hours. No results for input(s): AMMONIA in the last 168 hours.  CBC:  Recent Labs Lab 07/03/16 1206 07/03/16 1931 07/04/16 0255  WBC 4.2 6.8 7.7   NEUTROABS 3.4  --   --   HGB 12.1 12.0 12.7  HCT 35.4 35.0 36.1  MCV 101.2* 99.1 99.1  PLT 98* 90* 74*    Cardiac Enzymes:  Recent Labs Lab 07/03/16 1206 07/04/16 0255  CKTOTAL 94  --   TROPONINI  --  <0.03    BNP: Invalid input(s): POCBNP  CBG:  Recent Labs Lab 07/03/16 2002  GLUCAP 127*    Microbiology: No results found for this or any previous visit.  Coagulation Studies: No results for input(s): LABPROT, INR in the last 72 hours.  Urinalysis:  Recent Labs Lab 07/03/16 2044  COLORURINE YELLOW  LABSPEC 1.020  PHURINE 5.0  GLUCOSEU NEGATIVE  HGBUR TRACE*  BILIRUBINUR NEGATIVE  KETONESUR NEGATIVE  PROTEINUR 30*  NITRITE NEGATIVE  LEUKOCYTESUR TRACE*    Lipid Panel:  No results found for: CHOL, TRIG, HDL, CHOLHDL, VLDL, LDLCALC  HgbA1C: No results found for: HGBA1C  Urine Drug Screen:     Component Value Date/Time   LABOPIA NONE DETECTED 07/03/2016 2044   COCAINSCRNUR NONE DETECTED 07/03/2016 2044   LABBENZ NONE DETECTED 07/03/2016 2044   AMPHETMU NONE DETECTED 07/03/2016 2044   THCU NONE DETECTED 07/03/2016 2044   LABBARB NONE DETECTED 07/03/2016 2044    Alcohol Level: No results for input(s): ETH in the last 168 hours.   Imaging: Ct Head Wo Contrast  Result Date: 07/03/2016 CLINICAL DATA:  Syncope, fatigue.  Seizure-like activity. EXAM: CT HEAD WITHOUT CONTRAST TECHNIQUE: Contiguous axial images were obtained from the base of the skull through the vertex without intravenous contrast. COMPARISON:  None. FINDINGS: Brain: No acute intracranial abnormality. Specifically, no hemorrhage, hydrocephalus, mass lesion, acute infarction, or significant intracranial injury. Vascular: No hyperdense vessel or unexpected calcification. Skull: No acute calvarial abnormality. Sinuses/Orbits: Mucosal thickening in the right frontal sinus. Remainder the paranasal sinuses and mastoid air cells are clear. Orbital soft tissues unremarkable. Other: None IMPRESSION:  No acute intracranial abnormality. Chronic right frontal sinusitis. Electronically Signed   By: Charlett Nose M.D.   On: 07/03/2016 20:33     Assessment/Plan: 56 year old female with a history of ETOH abuse presenting after two generalized seizures.  Patient has not had any alcohol for the past 2-3 days and is tremulous.  Concerned that seizures provoked by ETOH withdrawal. Will rule out other possibilities.  Patient started on Keppra but if no etiology noted patient will not need to be continued on anticonvulsant therapy.   Head CT reviewed and shows no acute changes.  Lab work unremarkable.    Recommendations: 1.  EEG 2.  MRI of the brain 3.  Seizure precautions 4.  Ativan prn seizures 5.  CIWA      Thana Farr, MD Neurology [phone] 07/04/2016, 1:45 PM

## 2016-07-04 NOTE — Evaluation (Signed)
Physical Therapy Evaluation Patient Details Name: Holly Whitney MRN: [id number] DOB: 05/25/1960 Today's Date: 07/04/2016   History of Present Illness  Pt is a 56 y.o. female presenting to hospital with witnessed seizure event at home and pt also with witnessed seizure in ED with hypoxia.  Pt was seen earlier on day of admission for feeling lightheaded in heat.  Per chart pt with h/o syncopal events in past year.  Pt admitted with new onset generalized tonic clonic seizures and mild hypokalemia.      Clinical Impression  Prior to hospital admission, pt was independent with functional mobility.  Pt lives with her husband in 1 level home with 4 steps to enter with R railing.  Currently pt is SBA with bed mobility, CGA with transfers, and CGA with ambulation to bathroom and back (pt given vc's to hold onto IV pole d/t pt impulsively and quickly getting OOB to walk to bathroom and otherwise disregarding therapists cues to slow down in order to keep IV intact).  Pt demonstrating significant impulsiveness during session (especially going to bathroom but also on return from bathroom) and pt demonstrating decreased safety awareness regarding IV/IV pole safety with mobility and toilet seat being up with max cueing of therapist requiring significant assist from therapist to maintain pt safety during session.  Pt incontinent of urine on bathroom floor prior to reaching toilet and overall demonstrating general body "shakiness" with mobility.  Pt would benefit from skilled PT to address noted impairments and functional limitations (see below for any additional details).  Upon hospital discharge, recommend pt discharge to home.  Anticipate pt will continue to progress with functional mobility during hospitalization and be able to discharge home with support of family.    Follow Up Recommendations No PT follow up    Equipment Recommendations  None recommended by PT    Recommendations for Other Services        Precautions / Restrictions Precautions Precautions: Fall Precaution Comments: Seizure precautions Restrictions Weight Bearing Restrictions: No      Mobility  Bed Mobility Overal bed mobility: Needs Assistance Bed Mobility: Supine to Sit;Sit to Supine     Supine to sit: Supervision;HOB elevated Sit to supine: Supervision;HOB elevated   General bed mobility comments: pt impulsively sitting up to R side of bed and then after cued for IV line switched to L side of bed; close SBA for safety and for lines  Transfers Overall transfer level: Needs assistance Equipment used: None Transfers: Sit to/from Stand Sit to Stand: Min guard         General transfer comment: pt impulsively standing from bed but steady without loss of balance; CGA from toilet  Ambulation/Gait Ambulation/Gait assistance: Min guard Ambulation Distance (Feet):  (25 feet x2) Assistive device:  (UE support on IV pole) Gait Pattern/deviations: Step-through pattern Gait velocity: increased   General Gait Details: impulsive/quick to/from bathroom (although quicker to bathroom)  Stairs            Wheelchair Mobility    Modified Rankin (Stroke Patients Only)       Balance Overall balance assessment: Needs assistance Sitting-balance support: No upper extremity supported;Feet supported Sitting balance-Leahy Scale: Good Sitting balance - Comments: sitting reaching within BOS   Standing balance support: No upper extremity supported Standing balance-Leahy Scale: Good Standing balance comment: standing reaching within BOS  Pertinent Vitals/Pain Pain Assessment: No/denies pain  Vitals (HR and O2 on room air) stable and WFL throughout treatment session.    Home Living Family/patient expects to be discharged to:: Private residence Living Arrangements: Spouse/significant other Available Help at Discharge: Family Type of Home: House Home Access: Stairs to  enter Entrance Stairs-Rails: Right Entrance Stairs-Number of Steps: 4 Home Layout: One level Home Equipment: Grab bars - tub/shower      Prior Function Level of Independence: Independent         Comments: Pt denies any falls in past 6 months.     Hand Dominance        Extremity/Trunk Assessment   Upper Extremity Assessment Upper Extremity Assessment: Generalized weakness    Lower Extremity Assessment Lower Extremity Assessment: Generalized weakness    Cervical / Trunk Assessment Cervical / Trunk Assessment: Normal  Communication   Communication: No difficulties  Cognition Arousal/Alertness: Awake/alert Behavior During Therapy: Impulsive Overall Cognitive Status: No family/caregiver present to determine baseline cognitive functioning (Oriented to person, place, time, and situation)                                        General Comments General comments (skin integrity, edema, etc.): Pt sitting on edge of bed eating upon PT entry.  Nursing cleared pt for participation in physical therapy.  Pt agreeable to PT session.    Exercises     Assessment/Plan    PT Assessment Patient needs continued PT services  PT Problem List Decreased balance;Decreased mobility;Decreased strength;Decreased knowledge of use of DME;Decreased safety awareness;Decreased knowledge of precautions       PT Treatment Interventions DME instruction;Gait training;Stair training;Functional mobility training;Therapeutic activities;Therapeutic exercise;Balance training;Patient/family education    PT Goals (Current goals can be found in the Care Plan section)  Acute Rehab PT Goals Patient Stated Goal: to go home PT Goal Formulation: With patient Time For Goal Achievement: 07/18/16 Potential to Achieve Goals: Good    Frequency Min 2X/week   Barriers to discharge        Co-evaluation               AM-PAC PT "6 Clicks" Daily Activity  Outcome Measure Difficulty  turning over in bed (including adjusting bedclothes, sheets and blankets)?: A Little Difficulty moving from lying on back to sitting on the side of the bed? : A Little Difficulty sitting down on and standing up from a chair with arms (e.g., wheelchair, bedside commode, etc,.)?: A Little Help needed moving to and from a bed to chair (including a wheelchair)?: A Little Help needed walking in hospital room?: A Little Help needed climbing 3-5 steps with a railing? : A Little 6 Click Score: 18    End of Session Equipment Utilized During Treatment: Gait belt Activity Tolerance: Patient tolerated treatment well Patient left: in bed;with call bell/phone within reach;with bed alarm set Nurse Communication: Mobility status;Precautions (Pt's impulsiveness during session; pt requiring clean-up) PT Visit Diagnosis: Difficulty in walking, not elsewhere classified (R26.2);Muscle weakness (generalized) (M62.81)    Time: [id number] PT Time Calculation (min) (ACUTE ONLY): 22 min   Charges:   PT Evaluation $PT Eval Low Complexity: 1 Procedure     PT G CodesHendricks Limes:        Braelin Brosch, PT 07/04/16, 2:40 PM [phone]

## 2016-07-05 LAB — COMPREHENSIVE METABOLIC PANEL
ALBUMIN: 3.6 g/dL (ref 3.5–5.0)
ALT: 78 U/L — ABNORMAL HIGH (ref 14–54)
ANION GAP: 8 (ref 5–15)
AST: 102 U/L — ABNORMAL HIGH (ref 15–41)
Alkaline Phosphatase: 65 U/L (ref 38–126)
BUN: <5 mg/dL — ABNORMAL LOW (ref 6–20)
CO2: 26 mmol/L (ref 22–32)
Calcium: 9 mg/dL (ref 8.9–10.3)
Chloride: 105 mmol/L (ref 101–111)
Creatinine, Ser: 0.47 mg/dL (ref 0.44–1.00)
GFR calc non Af Amer: >60 mL/min (ref >60)
GLUCOSE: 99 mg/dL (ref 65–99)
POTASSIUM: 3.4 mmol/L — AB (ref 3.5–5.1)
SODIUM: 139 mmol/L (ref 135–145)
TOTAL PROTEIN: 7.3 g/dL (ref 6.5–8.1)
Total Bilirubin: 1.2 mg/dL (ref 0.3–1.2)

## 2016-07-05 LAB — CBC
HCT: 36.6 % (ref 35.0–47.0)
Hemoglobin: 12.4 g/dL (ref 12.0–16.0)
MCH: 34 pg (ref 26.0–34.0)
MCHC: 33.9 g/dL (ref 32.0–36.0)
MCV: 100.2 fL — ABNORMAL HIGH (ref 80.0–100.0)
Platelets: 95 10*3/uL — ABNORMAL LOW (ref 150–440)
RBC: 3.66 MIL/uL — ABNORMAL LOW (ref 3.80–5.20)
RDW: 15.3 % — AB (ref 11.5–14.5)
WBC: 4.1 10*3/uL (ref 3.6–11.0)

## 2016-07-05 LAB — HIV ANTIBODY (ROUTINE TESTING W REFLEX): HIV SCREEN 4TH GENERATION: NONREACTIVE

## 2016-07-05 MED ORDER — SENNOSIDES-DOCUSATE SODIUM 8.6-50 MG PO TABS: 1.0000 | ORAL_TABLET | Freq: Every evening | ORAL | Status: AC | PRN

## 2016-07-05 MED ORDER — FOLIC ACID 1 MG PO TABS: 1.0000 mg | ORAL_TABLET | Freq: Every day | ORAL | Status: AC

## 2016-07-05 MED ORDER — THIAMINE HCL 100 MG PO TABS: 100.0000 mg | ORAL_TABLET | Freq: Every day | ORAL | Status: AC

## 2016-07-05 MED ORDER — ADULT MULTIVITAMIN W/MINERALS CH: 1.0000 | ORAL_TABLET | Freq: Every day | ORAL | Status: AC

## 2016-07-05 MED ORDER — BOOST / RESOURCE BREEZE PO LIQD: 1.0000 | Freq: Three times a day (TID) | ORAL | 0 refills | Status: AC

## 2016-07-05 NOTE — Discharge Summary (Signed)
[facility] - [place] at [facility]   PATIENT NAME: Holly Whitney    MR#:  [id number]  DATE OF BIRTH:  12/13/1960  DATE OF ADMISSION:  07/03/2016 ADMITTING PHYSICIAN: Tonye Royalty, DO  DATE OF DISCHARGE: 07/05/2016 PRIMARY CARE PHYSICIAN: Patient, No Pcp Per    ADMISSION DIAGNOSIS:  Seizure disorder (HCC) [G40.909]  DISCHARGE DIAGNOSIS:  Active Problems:   New onset seizure (HCC)   Seizure disorder (HCC) Alcohol abuse disorder  SECONDARY DIAGNOSIS:  History reviewed. No pertinent past medical history.  HOSPITAL COURSE:  HPI: Holly Whitney is a 56 y.o. female with no known history  presents to the emergency department for evaluation of Witnessed seizure.  Patient was in a usual state of health until earlier the day patient was seen and evaluated in the emergency department for lightheadedness thought to be related to being outside in the heat however upon her arrival home family witnessed generalized tonic-clonic seizure lasting approximately 1 minute with a post ictal period. There was no apparent tongue bite or incontinence. She sustained a second seizure with associated hypoxia in the emergency department for which she was given 2 mg of Ativan and placed on oxygen.   At present she is lethargic secondary to sedation.    #. New-onset generalized tonic clonic - from alcoholic withdrawal  -EEG-no abnormalities; MRI brain with no acute abnormalities -IV Keppra, PRN Ativan initially given during the hospital course. Will discontinue Keppra -TSH normal -Okay to discharge patient from neurology standpoint discontinued Keppra as EEG is normal -Outpatient follow-up with neurology Patient unable to drive, operate heavy machinery, perform activities at heights and participate in water activities until release by outpatient physician.  Follow up with PCP at discharge.      #. Hypokalemia, mild -Replaced -  #. Transaminitis, mild. from alcohol  use -Improving, PCP to consider repeating an monitoring LFTs during follow-up visit  #Thrombocytopenia Discontinue Lovenox platelet count is at 74,000 Repeat platelet count after discontinuing Lovenox is at 95,000  #Alcohol abuse disorder CIWA Counseled patient to stop drinking alcohol Outpatient alcoholic anonymous follow-up PT evaluation -no PT needs identified  DISCHARGE CONDITIONS:  fair  CONSULTS OBTAINED:  Treatment Team:  Thana Farr, MD   PROCEDURES  EEG  DRUG ALLERGIES:  No Known Allergies  DISCHARGE MEDICATIONS:   Current Discharge Medication List    START taking these medications   Details  feeding supplement (BOOST / RESOURCE BREEZE) LIQD Take 1 Container by mouth 3 (three) times daily between meals. Qty: 90 Container, Refills: 0    folic acid (FOLVITE) 1 MG tablet Take 1 tablet (1 mg total) by mouth daily.    Multiple Vitamin (MULTIVITAMIN WITH MINERALS) TABS tablet Take 1 tablet by mouth daily.    senna-docusate (SENOKOT-S) 8.6-50 MG tablet Take 1 tablet by mouth at bedtime as needed for mild constipation.    thiamine 100 MG tablet Take 1 tablet (100 mg total) by mouth daily.      STOP taking these medications     ondansetron (ZOFRAN) 4 MG tablet          DISCHARGE INSTRUCTIONS:   Follow-up with primary care physician in a week Follow-up with neurology Dr. Malvin Johns in 2 weeks   Patient unable to drive, operate heavy machinery, perform activities at heights and participate in water activities until release by outpatient physician.  Follow up with PCP at discharge.      DIET:   Regular DISCHARGE CONDITION:  Stable  ACTIVITY:  Patient unable to drive,  operate heavy machinery, perform activities at heights and participate in water activities until release by outpatient physician.  Follow up with PCP at discharge.       OXYGEN:  Home Oxygen: No.   Oxygen Delivery: room air  DISCHARGE LOCATION:  home   If you experience  worsening of your admission symptoms, develop shortness of breath, life threatening emergency, suicidal or homicidal thoughts you must seek medical attention immediately by calling 911 or calling your MD immediately  if symptoms less severe.  You Must read complete instructions/literature along with all the possible adverse reactions/side effects for all the Medicines you take and that have been prescribed to you. Take any new Medicines after you have completely understood and accpet all the possible adverse reactions/side effects.   Please note  You were cared for by a hospitalist during your hospital stay. If you have any questions about your discharge medications or the care you received while you were in the hospital after you are discharged, you can call the unit and asked to speak with the hospitalist on call if the hospitalist that took care of you is not available. Once you are discharged, your primary care physician will handle any further medical issues. Please note that NO REFILLS for any discharge medications will be authorized once you are discharged, as it is imperative that you return to your primary care physician (or establish a relationship with a primary care physician if you do not have one) for your aftercare needs so that they can reassess your need for medications and monitor your lab values.     Today  Chief Complaint  Patient presents with  . Seizures  . Weakness    Patient is doing fine. No other episodes of seizures. No tremors. Feels fine. PT evaluated and no PT needs identified. Discharge patient home to family care. Reinforced the importance of compliance with the outpatient follow-up care and following with  AA   ROS:  CONSTITUTIONAL: Denies fevers, chills. Denies any fatigue, weakness.  EYES: Denies blurry vision, double vision, eye pain. EARS, NOSE, THROAT: Denies tinnitus, ear pain, hearing loss. RESPIRATORY: Denies cough, wheeze, shortness of breath.   CARDIOVASCULAR: Denies chest pain, palpitations, edema.  GASTROINTESTINAL: Denies nausea, vomiting, diarrhea, abdominal pain. Denies bright red blood per rectum. GENITOURINARY: Denies dysuria, hematuria. ENDOCRINE: Denies nocturia or thyroid problems. HEMATOLOGIC AND LYMPHATIC: Denies easy bruising or bleeding. SKIN: Denies rash or lesion. MUSCULOSKELETAL: Denies pain in neck, back, shoulder, knees, hips or arthritic symptoms.  NEUROLOGIC: Denies paralysis, paresthesias.  PSYCHIATRIC: Denies anxiety or depressive symptoms.   VITAL SIGNS:  Blood pressure (!) 141/86, pulse 83, temperature 98.5 F (36.9 C), temperature source Oral, resp. rate 20, height 5\' 2"  (1.575 m), weight 61.6 kg (135 lb 11.2 oz), SpO2 99 %.  I/O:    Intake/Output Summary (Last 24 hours) at 07/05/16 1304 Last data filed at 07/05/16 0948  Gross per 24 hour  Intake          2333.75 ml  Output                1 ml  Net          2332.75 ml    PHYSICAL EXAMINATION:  GENERAL:  56 y.o.-year-old patient lying in the bed with no acute distress.  EYES: Pupils equal, round, reactive to light and accommodation. No scleral icterus. Extraocular muscles intact.  HEENT: Head atraumatic, normocephalic. Oropharynx and nasopharynx clear.  NECK:  Supple, no jugular venous distention. No thyroid enlargement,  no tenderness.  LUNGS: Normal breath sounds bilaterally, no wheezing, rales,rhonchi or crepitation. No use of accessory muscles of respiration.  CARDIOVASCULAR: S1, S2 normal. No murmurs, rubs, or gallops.  ABDOMEN: Soft, non-tender, non-distended. Bowel sounds present. No organomegaly or mass.  EXTREMITIES: No pedal edema, cyanosis, or clubbing.  NEUROLOGIC: Cranial nerves II through XII are intact. Muscle strength 5/5 in all extremities. Sensation intact. Gait not checked.  PSYCHIATRIC: The patient is alert and oriented x 3.  SKIN: No obvious rash, lesion, or ulcer.   DATA REVIEW:   CBC  Recent Labs Lab 07/05/16 0412   WBC 4.1  HGB 12.4  HCT 36.6  PLT 95*    Chemistries   Recent Labs Lab 07/04/16 0255  07/05/16 0412  NA 135  --  139  K 3.2*  < > 3.4*  CL 100*  --  105  CO2 28  --  26  GLUCOSE 102*  --  99  BUN 6  --  <5*  CREATININE 0.50  --  0.47  CALCIUM 8.9  --  9.0  MG 1.8  --   --   AST  --   --  102*  ALT  --   --  78*  ALKPHOS  --   --  65  BILITOT  --   --  1.2  < > = values in this interval not displayed.  Cardiac Enzymes  Recent Labs Lab 07/04/16 0255  TROPONINI <0.03    Microbiology Results  No results found for this or any previous visit.  RADIOLOGY:  Ct Head Wo Contrast  Result Date: 07/03/2016 CLINICAL DATA:  Syncope, fatigue.  Seizure-like activity. EXAM: CT HEAD WITHOUT CONTRAST TECHNIQUE: Contiguous axial images were obtained from the base of the skull through the vertex without intravenous contrast. COMPARISON:  None. FINDINGS: Brain: No acute intracranial abnormality. Specifically, no hemorrhage, hydrocephalus, mass lesion, acute infarction, or significant intracranial injury. Vascular: No hyperdense vessel or unexpected calcification. Skull: No acute calvarial abnormality. Sinuses/Orbits: Mucosal thickening in the right frontal sinus. Remainder the paranasal sinuses and mastoid air cells are clear. Orbital soft tissues unremarkable. Other: None IMPRESSION: No acute intracranial abnormality. Chronic right frontal sinusitis. Electronically Signed   By: Charlett Nose M.D.   On: 07/03/2016 20:33   Mr Laqueta Jean ZO Contrast  Result Date: 07/04/2016 CLINICAL DATA:  Witnessed tonic-clonic seizure, possible alcohol withdrawal. EXAM: MRI HEAD WITHOUT AND WITH CONTRAST TECHNIQUE: Multiplanar, multiecho pulse sequences of the brain and surrounding structures were obtained without and with intravenous contrast. CONTRAST:  10mL MULTIHANCE GADOBENATE DIMEGLUMINE 529 MG/ML IV SOLN COMPARISON:  CT HEAD July 11, 2016 FINDINGS: Moderately motion degraded examination. INTRACRANIAL  CONTENTS: No reduced diffusion to suggest acute ischemia. No susceptibility artifact to suggest hemorrhage. The ventricles and sulci are mildly prominent for patient's age. Patchy supratentorial white matter FLAIR T2 hyperintensities. No suspicious parenchymal signal, masses, mass effect. No abnormal intraparenchymal or extra-axial enhancement. No abnormal extra-axial fluid collections. No extra-axial masses. Normal symmetric size and signal of the hippocampi, limited assessment of anatomy due to motion. VASCULAR: Normal major intracranial vascular flow voids present at skull base. SKULL AND UPPER CERVICAL SPINE: No abnormal sellar expansion. No suspicious calvarial bone marrow signal. Craniocervical junction maintained. Advanced degenerative change of the included cervical spine. SINUSES/ORBITS: Trace paranasal sinus mucosal thickening, chronic RIGHT frontal sinusitis.The included ocular globes and orbital contents are non-suspicious. OTHER: None. IMPRESSION: No acute intracranial process on this moderately motion degraded examination. Mild parenchymal brain volume loss for age. Mild  chronic small vessel ischemic disease. Electronically Signed   By: Awilda Metroourtnay  Bloomer M.D.   On: 07/04/2016 22:14    EKG:   Orders placed or performed during the hospital encounter of 07/03/16  . EKG 12-Lead      Management plans discussed with the patient, family and they are in agreement.  CODE STATUS:     Code Status Orders        Start     Ordered   07/04/16 0128  Full code  Continuous     07/04/16 0129    Code Status History    Date Active Date Inactive Code Status Order ID Comments User Context   This patient has a current code status but no historical code status.      TOTAL TIME TAKING CARE OF THIS PATIENT: 45  minutes.   Note: This dictation was prepared with Dragon dictation along with smaller phrase technology. Any transcriptional errors that result from this process are  unintentional.   @M[facility] @  on 07/05/2016 at 1:04 PM  Between 7am to 6pm - Pager - [phone]  After 6pm go to [email or web address] - password [email or web address]  [facility]  Office  [phone]  CC: Primary care physician; Patient, No Pcp Per

## 2016-07-05 NOTE — Care Management (Signed)
Admitted to [facility] with diagnosis of new onset of seizures (CIWA). Lives with husband, Ivar DrapeWillie [phone]). States she hasn't seen a physician in many years. No insurance listed. States she has gotten prescriptions filled at [facility] on [address]. Takes care of all basic activities of daily living herself, doesn't drive. No falls. Good appetite. No medical equipment in the home. Friend will transport home. [place]. Never been to the [facility] and Medication Management Physical therapy evaluation completed. No follow-up needs. Discharge to home per Dr. Ruta HindsGouru Erline Siddoway S Zamier Eggebrecht RN MSN CCM Care Management [phone]

## 2016-07-05 NOTE — Progress Notes (Signed)
Subjective: Patient without further seizures.  Feels back to baseline.    Objective: Current vital signs: BP (!) 141/86 (BP Location: Right Arm)   Pulse 83   Temp 98.5 F (36.9 C) (Oral)   Resp 20   Ht 5\' 2"  (1.575 m)   Wt 61.6 kg (135 lb 11.2 oz) Comment: bed scale  SpO2 99%   BMI 24.82 kg/m  Vital signs in last 24 hours: Temp:  [98 F (36.7 C)-98.5 F (36.9 C)] 98.5 F (36.9 C) (06/22 0510) Pulse Rate:  [83-88] 83 (06/22 0510) Resp:  [18-20] 20 (06/22 0510) BP: (130-148)/(81-89) 141/86 (06/22 0510) SpO2:  [97 %-100 %] 99 % (06/22 0510) Weight:  [61.6 kg (135 lb 11.2 oz)] 61.6 kg (135 lb 11.2 oz) (06/21 1345)  Intake/Output from previous day: 06/21 0701 - 06/22 0700 In: 2213.8 [P.O.:480; I.V.:1523.8; IV Piggyback:210] Out: 1 [Urine:1] Intake/Output this shift: Total I/O In: 120 [P.O.:120] Out: -  Nutritional status: DIET SOFT Room service appropriate? Yes; Fluid consistency: Thin  Neurologic Exam: Mental Status: Alert, oriented, thought content appropriate.  Speech fluent without evidence of aphasia.  Able to follow 3 step commands without difficulty. Cranial Nerves: II: Discs flat bilaterally; Visual fields grossly normal, pupils equal, round, reactive to light and accommodation III,IV, VI: ptosis not present, extra-ocular motions intact bilaterally V,VII: smile symmetric, facial light touch sensation normal bilaterally VIII: hearing normal bilaterally IX,X: gag reflex present XI: bilateral shoulder shrug XII: midline tongue extension Motor: Right :  Upper extremity   5/5                                      Left:     Upper extremity   5/5             Lower extremity   5/5                                                  Lower extremity   5/5 Tone and bulk:normal tone throughout; no atrophy noted.  Patient tremulous throughout. Sensory: Pinprick and light touch intact throughout, bilaterally   Lab Results: Basic Metabolic Panel:  Recent Labs Lab  07/03/16 1206 07/03/16 1931 07/04/16 0255 07/04/16 2016 07/05/16 0412  NA 136 136 135  --  139  K 4.0 3.1* 3.2* 3.6 3.4*  CL 101 102 100*  --  105  CO2 21* 22 28  --  26  GLUCOSE 177* 142* 102*  --  99  BUN 13 9 6   --  <5*  CREATININE 0.71 0.67 0.50  --  0.47  CALCIUM 8.9 8.8* 8.9  --  9.0  MG 1.4*  --  1.8  --   --   PHOS 3.4  --   --   --   --     Liver Function Tests:  Recent Labs Lab 07/03/16 1206 07/05/16 0412  AST 193* 102*  ALT 114* 78*  ALKPHOS 72 65  BILITOT 1.3* 1.2  PROT 8.9* 7.3  ALBUMIN 4.6 3.6   No results for input(s): LIPASE, AMYLASE in the last 168 hours. No results for input(s): AMMONIA in the last 168 hours.  CBC:  Recent Labs Lab 07/03/16 1206 07/03/16 1931 07/04/16 0255 07/05/16 0412  WBC 4.2 6.8 7.7 4.1  NEUTROABS 3.4  --   --   --   HGB 12.1 12.0 12.7 12.4  HCT 35.4 35.0 36.1 36.6  MCV 101.2* 99.1 99.1 100.2*  PLT 98* 90* 74* 95*    Cardiac Enzymes:  Recent Labs Lab 07/03/16 1206 07/04/16 0255  CKTOTAL 94  --   TROPONINI  --  <0.03    Lipid Panel: No results for input(s): CHOL, TRIG, HDL, CHOLHDL, VLDL, LDLCALC in the last 168 hours.  CBG:  Recent Labs Lab 07/03/16 2002  GLUCAP 127*    Microbiology: No results found for this or any previous visit.  Coagulation Studies: No results for input(s): LABPROT, INR in the last 72 hours.  Imaging: Ct Head Wo Contrast  Result Date: 07/03/2016 CLINICAL DATA:  Syncope, fatigue.  Seizure-like activity. EXAM: CT HEAD WITHOUT CONTRAST TECHNIQUE: Contiguous axial images were obtained from the base of the skull through the vertex without intravenous contrast. COMPARISON:  None. FINDINGS: Brain: No acute intracranial abnormality. Specifically, no hemorrhage, hydrocephalus, mass lesion, acute infarction, or significant intracranial injury. Vascular: No hyperdense vessel or unexpected calcification. Skull: No acute calvarial abnormality. Sinuses/Orbits: Mucosal thickening in the right  frontal sinus. Remainder the paranasal sinuses and mastoid air cells are clear. Orbital soft tissues unremarkable. Other: None IMPRESSION: No acute intracranial abnormality. Chronic right frontal sinusitis. Electronically Signed   By: Charlett Nose M.D.   On: 07/03/2016 20:33   Mr Laqueta Jean IH Contrast  Result Date: 07/04/2016 CLINICAL DATA:  Witnessed tonic-clonic seizure, possible alcohol withdrawal. EXAM: MRI HEAD WITHOUT AND WITH CONTRAST TECHNIQUE: Multiplanar, multiecho pulse sequences of the brain and surrounding structures were obtained without and with intravenous contrast. CONTRAST:  10mL MULTIHANCE GADOBENATE DIMEGLUMINE 529 MG/ML IV SOLN COMPARISON:  CT HEAD July 11, 2016 FINDINGS: Moderately motion degraded examination. INTRACRANIAL CONTENTS: No reduced diffusion to suggest acute ischemia. No susceptibility artifact to suggest hemorrhage. The ventricles and sulci are mildly prominent for patient's age. Patchy supratentorial white matter FLAIR T2 hyperintensities. No suspicious parenchymal signal, masses, mass effect. No abnormal intraparenchymal or extra-axial enhancement. No abnormal extra-axial fluid collections. No extra-axial masses. Normal symmetric size and signal of the hippocampi, limited assessment of anatomy due to motion. VASCULAR: Normal major intracranial vascular flow voids present at skull base. SKULL AND UPPER CERVICAL SPINE: No abnormal sellar expansion. No suspicious calvarial bone marrow signal. Craniocervical junction maintained. Advanced degenerative change of the included cervical spine. SINUSES/ORBITS: Trace paranasal sinus mucosal thickening, chronic RIGHT frontal sinusitis.The included ocular globes and orbital contents are non-suspicious. OTHER: None. IMPRESSION: No acute intracranial process on this moderately motion degraded examination. Mild parenchymal brain volume loss for age. Mild chronic small vessel ischemic disease. Electronically Signed   By: Awilda Metro M.D.    On: 07/04/2016 22:14    Medications:  I have reviewed the patient's current medications. Scheduled: . feeding supplement  1 Container Oral TID BM  . folic acid  1 mg Oral Daily  . multivitamin with minerals  1 tablet Oral Daily  . thiamine  100 mg Oral Daily   Or  . thiamine  100 mg Intravenous Daily    Assessment/Plan: No further seizures noted.  EEG unremarkable.  MRI of the brain reviewed and shows no acute changes.  Presenting seizures likely alcohol withdrawal seizures.    Recommendations: 1.  D/C Keppra 2.  Patient unable to drive, operate heavy machinery, perform activities at heights and participate in water activities until release by outpatient physician.  Follow up with neurology on an outpatient basis.  LOS: 1 day   Thana Farr, MD Neurology [phone] 07/05/2016  10:44 AM

## 2016-07-05 NOTE — Discharge Instructions (Signed)
Follow-up with primary care physician in a week Follow-up with neurology Dr. Malvin JohnsPotter in 2 weeks   Patient unable to drive, operate heavy machinery, perform activities at heights and participate in water activities until release by outpatient physician.  Follow up with PCP at discharge.

## 2016-07-05 NOTE — Progress Notes (Signed)
Physical Therapy Treatment Patient Details Name: Holly Whitney MRN: [id number] DOB: 02-Aug-1960 Today's Date: 07/05/2016    History of Present Illness Pt is a 56 y.o. female presenting to hospital with witnessed seizure event at home and pt also with witnessed seizure in ED with hypoxia.  Pt was seen earlier on day of admission for feeling lightheaded in heat.  Per chart pt with h/o syncopal events in past year.  Pt admitted with new onset generalized tonic clonic seizures and mild hypokalemia.      PT Comments    Pt still demonstrating some impulsiveness today but overall demonstrating significant improvement in ability to follow commands appropriately and also safety awareness during therapy session.  Pt scored 23/24 on Dynamic Gait Index (balance assessment) indicating pt is at low risk for falls.  Pt able to ambulate around nursing station and navigate stairs without any difficulties or loss of balance.  No further skilled PT needs identified during hospitalization; will discharge pt from PT in house.  Please re-consult PT if pt's status changes and acute PT needs are identified.    Follow Up Recommendations  No PT follow up     Equipment Recommendations  None recommended by PT    Recommendations for Other Services       Precautions / Restrictions Precautions Precautions: Fall Precaution Comments: Seizure precautions Restrictions Weight Bearing Restrictions: No    Mobility  Bed Mobility Overal bed mobility: Independent Bed Mobility: Supine to Sit;Sit to Supine     Supine to sit: Independent Sit to supine: Independent   General bed mobility comments: bed flat; no difficulties noted  Transfers Overall transfer level: Independent Equipment used: None Transfers: Sit to/from Stand Sit to Stand: Independent         General transfer comment: strong steady stand  Ambulation/Gait Ambulation/Gait assistance: Independent Ambulation Distance (Feet): 240 Feet Assistive  device: None Gait Pattern/deviations: WFL(Within Functional Limits);Step-through pattern   Gait velocity interpretation: at or above normal speed for age/gender General Gait Details: pt initially mildly unsteady with ambulation (PT providing CGA for safety but after about 80 feet pt's balance significantly improving and transitioned from SBA to independent ambulation)   Stairs Stairs: Yes   Stair Management: One rail Left;Forwards Number of Stairs: 4 General stair comments: alternating pattern ascending stairs; step to pattern descending stairs; steady without loss of balance  Wheelchair Mobility    Modified Rankin (Stroke Patients Only)       Balance Overall balance assessment: Independent                               Standardized Balance Assessment Standardized Balance Assessment : Dynamic Gait Index   Dynamic Gait Index Level Surface: Normal Change in Gait Speed: Normal Gait with Horizontal Head Turns: Normal Gait with Vertical Head Turns: Normal Gait and Pivot Turn: Normal Step Over Obstacle: Normal Step Around Obstacles: Normal Steps: Mild Impairment Total Score: 23      Cognition Arousal/Alertness: Awake/alert Behavior During Therapy: Impulsive Overall Cognitive Status: Within Functional Limits for tasks assessed                                        Exercises      General Comments General comments (skin integrity, edema, etc.): Pt sitting in bed with friend and family member present upon PT entry.  Nursing cleared pt  for participation in physical therapy.  Pt agreeable to PT session.      Pertinent Vitals/Pain Pain Assessment: No/denies pain  Vitals (HR and O2 on room air) stable and WFL throughout treatment session.    Home Living                      Prior Function            PT Goals (current goals can now be found in the care plan section) Acute Rehab PT Goals Patient Stated Goal: to go home PT Goal  Formulation: With patient Time For Goal Achievement: 07/18/16 Potential to Achieve Goals: Good Progress towards PT goals: Goals met/education completed, patient discharged from PT    Frequency    Other (Comment) (Discharge pt from PT in house.)      PT Plan Other (comment);Frequency needs to be updated (Discharge pt from PT in house.)    Co-evaluation              AM-PAC PT "6 Clicks" Daily Activity  Outcome Measure  Difficulty turning over in bed (including adjusting bedclothes, sheets and blankets)?: None Difficulty moving from lying on back to sitting on the side of the bed? : None Difficulty sitting down on and standing up from a chair with arms (e.g., wheelchair, bedside commode, etc,.)?: None Help needed moving to and from a bed to chair (including a wheelchair)?: None Help needed walking in hospital room?: None Help needed climbing 3-5 steps with a railing? : A Little 6 Click Score: 23    End of Session Equipment Utilized During Treatment: Gait belt Activity Tolerance: Patient tolerated treatment well Patient left: in bed;with call bell/phone within reach;with bed alarm set;with family/visitor present Nurse Communication: Mobility status;Precautions PT Visit Diagnosis: Difficulty in walking, not elsewhere classified (R26.2);Muscle weakness (generalized) (M62.81)     Time: 0263-7858 PT Time Calculation (min) (ACUTE ONLY): 14 min  Charges:  $Therapeutic Activity: 8-22 mins                    G CodesLeitha Bleak, PT 07/05/16, 11:45 AM [phone]

## 2016-07-05 NOTE — Progress Notes (Signed)
Discharge paperwork reviewed with patient who verbalized understanding. New medications and Follow up apts reviewed with patient. Patient is stable and ready for discharge. Patient's family to transport home.

## 2017-02-24 ENCOUNTER — Emergency Department
Admission: EM | Admit: 2017-02-24 | Discharge: 2017-02-24 | Disposition: A | Discharge status: Home / Self Care | Payer: BLUE CROSS/BLUE SHIELD | Attending: Emergency Medicine | Admitting: Emergency Medicine

## 2017-02-24 ENCOUNTER — Encounter: Payer: Self-pay | Admitting: Emergency Medicine

## 2017-02-24 ENCOUNTER — Other Ambulatory Visit: Payer: Self-pay

## 2017-02-24 DIAGNOSIS — R569 Unspecified convulsions: Secondary | ICD-10-CM | POA: Diagnosis present

## 2017-02-24 DIAGNOSIS — F10239 Alcohol dependence with withdrawal, unspecified: Secondary | ICD-10-CM | POA: Insufficient documentation

## 2017-02-24 DIAGNOSIS — F10939 Alcohol use, unspecified with withdrawal, unspecified: Secondary | ICD-10-CM

## 2017-02-24 DIAGNOSIS — Z87891 Personal history of nicotine dependence: Secondary | ICD-10-CM | POA: Insufficient documentation

## 2017-02-24 DIAGNOSIS — Z79899 Other long term (current) drug therapy: Secondary | ICD-10-CM | POA: Insufficient documentation

## 2017-02-24 DIAGNOSIS — F1010 Alcohol abuse, uncomplicated: Secondary | ICD-10-CM

## 2017-02-24 LAB — BASIC METABOLIC PANEL
Anion gap: 15 (ref 5–15)
BUN: 15 mg/dL (ref 6–20)
CO2: 17 mmol/L — ABNORMAL LOW (ref 22–32)
CREATININE: 0.86 mg/dL (ref 0.44–1.00)
Calcium: 8.9 mg/dL (ref 8.9–10.3)
Chloride: 99 mmol/L — ABNORMAL LOW (ref 101–111)
GFR calc Af Amer: >60 mL/min (ref >60)
GLUCOSE: 157 mg/dL — AB (ref 65–99)
POTASSIUM: 3.3 mmol/L — AB (ref 3.5–5.1)
SODIUM: 131 mmol/L — AB (ref 135–145)

## 2017-02-24 LAB — HEPATIC FUNCTION PANEL
ALT: 83 U/L — AB (ref 14–54)
AST: 207 U/L — ABNORMAL HIGH (ref 15–41)
Albumin: 3.9 g/dL (ref 3.5–5.0)
Alkaline Phosphatase: 75 U/L (ref 38–126)
BILIRUBIN DIRECT: 0.3 mg/dL (ref 0.1–0.5)
BILIRUBIN INDIRECT: 0.9 mg/dL (ref 0.3–0.9)
Total Bilirubin: 1.2 mg/dL (ref 0.3–1.2)
Total Protein: 7.8 g/dL (ref 6.5–8.1)

## 2017-02-24 LAB — CBC WITH DIFFERENTIAL/PLATELET
BASOS ABS: 0 10*3/uL (ref 0–0.1)
BASOS PCT: 1 %
EOS PCT: 1 %
Eosinophils Absolute: 0 10*3/uL (ref 0–0.7)
HEMATOCRIT: 37.8 % (ref 35.0–47.0)
Hemoglobin: 12.7 g/dL (ref 12.0–16.0)
Lymphocytes Relative: 30 %
Lymphs Abs: 1 10*3/uL (ref 1.0–3.6)
MCH: 33.4 pg (ref 26.0–34.0)
MCHC: 33.7 g/dL (ref 32.0–36.0)
MCV: 99.1 fL (ref 80.0–100.0)
MONO ABS: 0.2 10*3/uL (ref 0.2–0.9)
Monocytes Relative: 6 %
NEUTROS ABS: 2.2 10*3/uL (ref 1.4–6.5)
Neutrophils Relative %: 64 %
Platelets: 134 10*3/uL — ABNORMAL LOW (ref 150–440)
RBC: 3.81 MIL/uL (ref 3.80–5.20)
RDW: 13.6 % (ref 11.5–14.5)
WBC: 3.5 10*3/uL — AB (ref 3.6–11.0)

## 2017-02-24 LAB — ETHANOL: Alcohol, Ethyl (B): <10 mg/dL (ref <10)

## 2017-02-24 MED ORDER — CHLORDIAZEPOXIDE HCL 25 MG PO CAPS: 25.0000 mg | ORAL_CAPSULE | Freq: Three times a day (TID) | ORAL | 0 refills | Status: DC | PRN

## 2017-02-24 MED ORDER — CHLORDIAZEPOXIDE HCL 25 MG PO CAPS
50.0000 mg | ORAL_CAPSULE | Freq: Once | ORAL | Status: AC
  Administered 2017-02-24: 50 mg via ORAL
  Filled 2017-02-24: qty 2

## 2017-02-24 MED ORDER — LEVETIRACETAM 500 MG PO TABS: 1000.0000 mg | ORAL_TABLET | Freq: Once | ORAL | Status: DC

## 2017-02-24 NOTE — BH Assessment (Signed)
Assessment Note  Holly Whitney is an 57 y.o. female who presents to the ER due to getting dizzy at work and feeling as though she was going to faint. Per information provided by ER staff, the patient possibly had a seizure while at work. With this Clinical research associate, she reports of drinking approximately six beers a day and it's been ongoing for approximately fifteen years. On today (02/24/2017), she states she felt hot believes it was due to her been "upstairs at work and on my feet all day.."   During the interview, the patient was calm, cooperative and pleasant. She was able to answer questions with appropriate answers. She denies SI/HI and AV/H. She denies the use of any other mind-altering substances. UDS wasn't available during the time of this interview. Her BAC was negative of any alcohol. However, she reports her last use was within the last 24 hours. Patient denies having a history of violence and aggression and no current involvement with the legal system.  She also requested information for detox facilities and would follow up with them, possibly next week.  Diagnosis: Alcohol use disorder  Past Medical History: History reviewed. No pertinent past medical history.  History reviewed. No pertinent surgical history.  Family History: No family history on file.  Social History:  reports that she quit smoking about 7 years ago. Her smoking use included cigarettes. she has never used smokeless tobacco. She reports that she drinks alcohol. She reports that she does not use drugs.  Additional Social History:  Alcohol / Drug Use Pain Medications: See PTA Prescriptions: See PTA Over the Counter: See PTA History of alcohol / drug use?: Yes Longest period of sobriety (when/how long): Unable to quantify Negative Consequences of Use: Personal relationships Substance #1 Name of Substance 1: Alcohol 1 - Age of First Use: 19 1 - Amount (size/oz): "Like a six beers." 1 - Frequency: Daily 1 - Duration:  "About 15 years." 1 - Last Use / Amount: 02/24/2017  CIWA: CIWA-Ar BP: (!) 94/53 Pulse Rate: 97 Nausea and Vomiting: mild nausea with no vomiting Tactile Disturbances: none Tremor: not visible, but can be felt fingertip to fingertip Auditory Disturbances: not present Paroxysmal Sweats: no sweat visible Visual Disturbances: not present Anxiety: no anxiety, at ease Headache, Fullness in Head: none present Agitation: normal activity Orientation and Clouding of Sensorium: oriented and can do serial additions CIWA-Ar Total: 2 COWS:    Allergies: No Known Allergies  Home Medications:  (Not in a hospital admission)  OB/GYN Status:  No LMP recorded. Patient is postmenopausal.  General Assessment Data Location of Assessment: [facility] ED TTS Assessment: In system Is this a Tele or Face-to-Face Assessment?: Face-to-Face Is this an Initial Assessment or a Re-assessment for this encounter?: Initial Assessment Marital status: Single Maiden name: n/a Is patient pregnant?: No Pregnancy Status: No Living Arrangements: Spouse/significant other Can pt return to current living arrangement?: Yes Admission Status: Voluntary Is patient capable of signing voluntary admission?: Yes Referral Source: Self/Family/Friend Insurance type: Scientist, research (physical sciences) Exam [facility] Walk-in ONLY) Medical Exam completed: Yes  Crisis Care Plan Living Arrangements: Spouse/significant other Legal Guardian: Other:(Self) Name of Psychiatrist: Reports of none Name of Therapist: Reports of none  Education Status Is patient currently in school?: No Current Grade: n/a Highest grade of school patient has completed: High School Diploma Name of school: n/a Contact person: n/a  Risk to self with the past 6 months Suicidal Ideation: No Has patient been a risk to self within the past 6 months  prior to admission? : No Suicidal Intent: No Has patient had any suicidal intent within the past 6 months prior to admission?  : No Is patient at risk for suicide?: No Suicidal Plan?: No Has patient had any suicidal plan within the past 6 months prior to admission? : No Access to Means: No What has been your use of drugs/alcohol within the last 12 months?: Alcohol Previous Attempts/Gestures: No How many times?: 0 Other Self Harm Risks: Active Addiction Triggers for Past Attempts: None known Intentional Self Injurious Behavior: None Family Suicide History: Unknown Recent stressful life event(s): Other (Comment)(Reports of none) Persecutory voices/beliefs?: No Depression: No Depression Symptoms: (Reports of none) Substance abuse history and/or treatment for substance abuse?: Yes Suicide prevention information given to non-admitted patients: Not applicable  Risk to Others within the past 6 months Homicidal Ideation: No Does patient have any lifetime risk of violence toward others beyond the six months prior to admission? : No Thoughts of Harm to Others: No Current Homicidal Intent: No Current Homicidal Plan: No Access to Homicidal Means: No Identified Victim: Reports of none History of harm to others?: No Assessment of Violence: None Noted Violent Behavior Description: Reports of none Does patient have access to weapons?: No Criminal Charges Pending?: No Does patient have a court date: No Is patient on probation?: No  Psychosis Hallucinations: None noted Delusions: None noted  Mental Status Report Appearance/Hygiene: Unremarkable, In hospital gown Eye Contact: Good Motor Activity: Unable to assess(Patient laying in the bed) Speech: Logical/coherent, Unremarkable Level of Consciousness: Alert Mood: Pleasant Affect: Appropriate to circumstance Anxiety Level: None Thought Processes: Coherent, Relevant Judgement: Unimpaired Orientation: Person, Place, Time, Situation, Appropriate for developmental age Obsessive Compulsive Thoughts/Behaviors: None  Cognitive Functioning Concentration:  Normal Memory: Recent Intact, Remote Intact IQ: Average Insight: Fair Impulse Control: Fair Appetite: Good Weight Loss: 0 Weight Gain: 0 Sleep: No Change Total Hours of Sleep: 7 Vegetative Symptoms: None  ADLScreening [facility] Assessment Services) Patient's cognitive ability adequate to safely complete daily activities?: Yes Patient able to express need for assistance with ADLs?: Yes Independently performs ADLs?: Yes (appropriate for developmental age)  Prior Inpatient Therapy Prior Inpatient Therapy: No Prior Therapy Dates: Reports of none Prior Therapy Facilty/Provider(s): Reports of none Reason for Treatment: Reports of none  Prior Outpatient Therapy Prior Outpatient Therapy: No Prior Therapy Dates: Reports of none Prior Therapy Facilty/Provider(s): Reports of none Reason for Treatment: Reports of none Does patient have an ACCT team?: No Does patient have Intensive In-House Services?  : No Does patient have [facility] services? : No Does patient have P4CC services?: No  ADL Screening (condition at time of admission) Patient's cognitive ability adequate to safely complete daily activities?: Yes Is the patient deaf or have difficulty hearing?: No Does the patient have difficulty seeing, even when wearing glasses/contacts?: No Does the patient have difficulty concentrating, remembering, or making decisions?: No Patient able to express need for assistance with ADLs?: Yes Does the patient have difficulty dressing or bathing?: No Independently performs ADLs?: Yes (appropriate for developmental age) Does the patient have difficulty walking or climbing stairs?: No Weakness of Legs: None Weakness of Arms/Hands: None  Home Assistive Devices/Equipment Home Assistive Devices/Equipment: None  Therapy Consults (therapy consults require a physician order) PT Evaluation Needed: No OT Evalulation Needed: No SLP Evaluation Needed: No Abuse/Neglect Assessment (Assessment to be complete  while patient is alone) Abuse/Neglect Assessment Can Be Completed: Yes Physical Abuse: Denies Verbal Abuse: Denies Sexual Abuse: Denies Exploitation of patient/patient's resources: Denies Self-Neglect: Denies Values /  Beliefs Cultural Requests During Hospitalization: None Spiritual Requests During Hospitalization: None Consults Spiritual Care Consult Needed: No Social Work Consult Needed: No Merchant navy officer (For Healthcare) Does Patient Have a Medical Advance Directive?: No    Additional Information 1:1 In Past 12 Months?: No CIRT Risk: No Elopement Risk: No Does patient have medical clearance?: Yes  Child/Adolescent Assessment Running Away Risk: Denies(Patient is an adult)  Disposition:  Disposition Initial Assessment Completed for this Encounter: Yes Disposition of Patient: Other dispositions Other disposition(s): Information only  On Site Evaluation by:   Reviewed with Physician:    Lilyan Gilford MS, LCAS, LPC, NCC, CCSI Therapeutic Triage Specialist 02/24/2017 6:22 PM

## 2017-02-24 NOTE — ED Provider Notes (Signed)
TTS is seen the patient she does not want anything from them. She does want to leave we will discharge her. Count was come back if she changes her mind later.   Arnaldo NatalMalinda, Alexzandrea Normington F, MD 02/24/17 [phone]

## 2017-02-24 NOTE — BH Assessment (Addendum)
Discussed patient with ER MD (Dr. Darnelle CatalanMalinda) and patient is able to discharge home when medically cleared. Patient was giving referral information and instructions on how to follow up with detox facility [facility] and [facility].  Writer also advised the patient to call the toll free phone on his insurance card for the counselors and agencies in her network.  Patient denies SI/HI and AV/H. ____________________ [facility] [phone] [place], [place] [id number]

## 2017-02-24 NOTE — Discharge Instructions (Signed)
Please take your Librium up to 3 times a day to help you with anxiety and tremors.  Return to the emergency department for any concerns such as headache, fevers, chills, if you have another seizure, or for any other issues whatsoever.  It was a pleasure to take care of you today, and thank you for coming to our emergency department.  If you have any questions or concerns before leaving please ask the nurse to grab me and I'm more than happy to go through your aftercare instructions again.  If you were prescribed any opioid pain medication today such as Norco, Vicodin, Percocet, morphine, hydrocodone, or oxycodone please make sure you do not drive when you are taking this medication as it can alter your ability to drive safely.  If you have any concerns once you are home that you are not improving or are in fact getting worse before you can make it to your follow-up appointment, please do not hesitate to call 911 and come back for further evaluation.  Merrily BrittleNeil Aayla Marrocco, MD  Results for orders placed or performed during the hospital encounter of 02/24/17  Basic metabolic panel  Result Value Ref Range   Sodium 131 (L) 135 - 145 mmol/L   Potassium 3.3 (L) 3.5 - 5.1 mmol/L   Chloride 99 (L) 101 - 111 mmol/L   CO2 17 (L) 22 - 32 mmol/L   Glucose, Bld 157 (H) 65 - 99 mg/dL   BUN 15 6 - 20 mg/dL   Creatinine, Ser 4.090.86 0.44 - 1.00 mg/dL   Calcium 8.9 8.9 - 81.110.3 mg/dL   GFR calc non Af Amer >60 >60 mL/min   GFR calc Af Amer >60 >60 mL/min   Anion gap 15 5 - 15  Hepatic function panel  Result Value Ref Range   Total Protein 7.8 6.5 - 8.1 g/dL   Albumin 3.9 3.5 - 5.0 g/dL   AST 914207 (H) 15 - 41 U/L   ALT 83 (H) 14 - 54 U/L   Alkaline Phosphatase 75 38 - 126 U/L   Total Bilirubin 1.2 0.3 - 1.2 mg/dL   Bilirubin, Direct 0.3 0.1 - 0.5 mg/dL   Indirect Bilirubin 0.9 0.3 - 0.9 mg/dL  CBC with Differential  Result Value Ref Range   WBC 3.5 (L) 3.6 - 11.0 K/uL   RBC 3.81 3.80 - 5.20 MIL/uL   Hemoglobin 12.7 12.0 - 16.0 g/dL   HCT 78.237.8 95.635.0 - 21.347.0 %   MCV 99.1 80.0 - 100.0 fL   MCH 33.4 26.0 - 34.0 pg   MCHC 33.7 32.0 - 36.0 g/dL   RDW 08.613.6 57.811.5 - 46.914.5 %   Platelets 134 (L) 150 - 440 K/uL   Neutrophils Relative % 64 %   Neutro Abs 2.2 1.4 - 6.5 K/uL   Lymphocytes Relative 30 %   Lymphs Abs 1.0 1.0 - 3.6 K/uL   Monocytes Relative 6 %   Monocytes Absolute 0.2 0.2 - 0.9 K/uL   Eosinophils Relative 1 %   Eosinophils Absolute 0.0 0 - 0.7 K/uL   Basophils Relative 1 %   Basophils Absolute 0.0 0 - 0.1 K/uL  Ethanol  Result Value Ref Range   Alcohol, Ethyl (B) <10 <10 mg/dL

## 2017-02-24 NOTE — ED Notes (Signed)
Pt seen by [facility]

## 2017-02-24 NOTE — ED Notes (Signed)
Resumed  Care from valerie rn.  Pt alert.  Pt states she is feeling better .  nsr on monitor.  Skin warm and dry.

## 2017-02-24 NOTE — ED Notes (Signed)
Pt alert. No seizure activity noted

## 2017-02-24 NOTE — ED Provider Notes (Addendum)
[facility] Emergency Department Provider Note  ____________________________________________   First MD Initiated Contact with Patient 02/24/17 1432     (approximate)  I have reviewed the triage vital signs and the nursing notes.   HISTORY  Chief Complaint Seizures   HPI Holly Whitney is a 57 y.o. female who comes to the emergency department via EMS after having a generalized tonic-clonic seizure while at work.  EMS noted a normal blood sugar with unremarkable vital signs.  She was postictal in route.  According to EMS the patient has one previous lifetime seizure and is not currently taking antiepileptics.  The patient herself reports a past medical history of heavy alcohol abuse and most recently drank alcohol about 24 hours ago.  She does report having alcohol withdrawal seizures in the past.  She is currently asymptomatic.  Her seizure today came on suddenly.  It went away quickly on its own.  She is amnestic to the event.  History reviewed. No pertinent past medical history.  Patient Active Problem List   Diagnosis Date Noted  . New onset seizure (HCC) 07/04/2016  . Seizure disorder [facility](HCC)     History reviewed. No pertinent surgical history.  Prior to Admission medications   Medication Sig Start Date End Date Taking? Authorizing Provider  chlordiazePOXIDE (LIBRIUM) 25 MG capsule Take 1 capsule (25 mg total) by mouth 3 (three) times daily as needed for withdrawal. 02/24/17   Merrily Brittleifenbark, Maleena Eddleman, MD  feeding supplement (BOOST / RESOURCE BREEZE) LIQD Take 1 Container by mouth 3 (three) times daily between meals. 07/05/16   Ramonita LabGouru, Aruna, MD  folic acid (FOLVITE) 1 MG tablet Take 1 tablet (1 mg total) by mouth daily. 07/06/16   Ramonita LabGouru, Aruna, MD  Multiple Vitamin (MULTIVITAMIN WITH MINERALS) TABS tablet Take 1 tablet by mouth daily. 07/06/16   Gouru, Deanna ArtisAruna, MD  senna-docusate (SENOKOT-S) 8.6-50 MG tablet Take 1 tablet by mouth at bedtime as needed for mild  constipation. 07/05/16   Ramonita LabGouru, Aruna, MD  thiamine 100 MG tablet Take 1 tablet (100 mg total) by mouth daily. 07/06/16   Ramonita LabGouru, Aruna, MD    Allergies Patient has no known allergies.  No family history on file.  Social History Social History   Tobacco Use  . Smoking status: Former Smoker    Types: Cigarettes    Last attempt to quit: 04/10/2009    Years since quitting: 7.8  . Smokeless tobacco: Never Used  Substance Use Topics  . Alcohol use: Yes    Comment: last drink on 02/22/17  . Drug use: No    Review of Systems Constitutional: No fever/chills Eyes: No visual changes. ENT: No sore throat. Cardiovascular: Denies chest pain. Respiratory: Denies shortness of breath. Gastrointestinal: No abdominal pain.  No nausea, no vomiting.  No diarrhea.  No constipation. Genitourinary: Negative for dysuria. Musculoskeletal: Negative for back pain. Skin: Negative for rash. Neurological: Negative for headaches, focal weakness or numbness.   ____________________________________________   PHYSICAL EXAM:  VITAL SIGNS: ED Triage Vitals  Enc Vitals Group     BP      Pulse      Resp      Temp      Temp src      SpO2      Weight      Height      Head Circumference      Peak Flow      Pain Score      Pain Loc  Pain Edu?      Excl. in GC?     Constitutional: Alert and oriented x4 mildly tremulous nontoxic no diaphoresis speaks in full clear sentences Eyes: PERRL EOMI. Head: Atraumatic. Nose: No congestion/rhinnorhea. Mouth/Throat: No trismus mild tremors to the tongue with bites to the left lateral aspect Neck: No stridor.   Cardiovascular: Tachycardic rate, regular rhythm. Grossly normal heart sounds.  Good peripheral circulation. Respiratory: Normal respiratory effort.  No retractions. Lungs CTAB and moving good air Gastrointestinal: Soft nontender Musculoskeletal: No hand tremors Neurologic:  Normal speech and language. No gross focal neurologic deficits are  appreciated. Skin:  Skin is warm, dry and intact. No rash noted. Psychiatric: Mood and affect are normal. Speech and behavior are normal.    ____________________________________________   DIFFERENTIAL includes but not limited to  Alcohol withdrawal seizure, dehydration, alcohol withdrawal, intracerebral hemorrhage, stroke ____________________________________________   LABS (all labs ordered are listed, but only abnormal results are displayed)  Labs Reviewed  BASIC METABOLIC PANEL - Abnormal; Notable for the following components:      Result Value   Sodium 131 (*)    Potassium 3.3 (*)    Chloride 99 (*)    CO2 17 (*)    Glucose, Bld 157 (*)    All other components within normal limits  HEPATIC FUNCTION PANEL - Abnormal; Notable for the following components:   AST 207 (*)    ALT 83 (*)    All other components within normal limits  CBC WITH DIFFERENTIAL/PLATELET - Abnormal; Notable for the following components:   WBC 3.5 (*)    Platelets 134 (*)    All other components within normal limits  ETHANOL  URINALYSIS, COMPLETE (UACMP) WITH MICROSCOPIC    Lab work reviewed by me with elevated AST and ALT consistent with chronic alcohol abuse. __________________________________________  EKG  ED ECG REPORT I, Merrily Brittle, the attending physician, personally viewed and interpreted this ECG.  Date: 02/24/2017 EKG Time:  Rate: 114 Rhythm: Sinus tachycardia QRS Axis: normal Intervals: normal ST/T Wave abnormalities: normal Narrative Interpretation: no evidence of acute ischemia  ____________________________________________  RADIOLOGY   ____________________________________________   PROCEDURES  Procedure(s) performed: no  Procedures  Critical Care performed: no  Observation: no ____________________________________________   INITIAL IMPRESSION / ASSESSMENT AND PLAN / ED COURSE  Pertinent labs & imaging results that were available during my care of the  patient were reviewed by me and considered in my medical decision making (see chart for details).  The patient arrives well-appearing with a nonfocal neuro exam.  She clearly did bite her tongue and she has very mild fasciculations.  On chart review she has seen Dr. Malvin Johns of neurology multiple times and has had an extensive workup for her seizures and her final diagnosis is alcohol withdrawal seizures.  Instead of antiepileptics I will give her a dose of chlordiazepoxide now pending labs.  ----------------------------------------- 3:18 PM on 02/24/2017 -----------------------------------------  Blood work is unremarkable aside from slight metabolic acidosis consistent with seizure.  The patient does express a desire to stop drinking alcohol.  Her benzodiazepine requirement at this point is low and does not require inpatient admission however I will reach out to [facility] for resources as an outpatient.      ____________________________________________   FINAL CLINICAL IMPRESSION(S) / ED DIAGNOSES  Final diagnoses:  Alcohol abuse  Alcohol withdrawal seizure with complication (HCC)      NEW MEDICATIONS STARTED DURING THIS VISIT:  New Prescriptions   CHLORDIAZEPOXIDE (LIBRIUM) 25 MG CAPSULE  Take 1 capsule (25 mg total) by mouth 3 (three) times daily as needed for withdrawal.     Note:  This document was prepared using Dragon voice recognition software and may include unintentional dictation errors.     Merrily Brittle, MD 02/24/17 1518    Merrily Brittle, MD 02/24/17 1549

## 2017-02-24 NOTE — ED Triage Notes (Signed)
[facility] Per EMS, coworkers state patient had a syncopal episode with seizure-like activity. Patient has history of possible seizure 1-2 months ago but denies taking medication for seizures. Upon arrival patient is alert and oriented x4 but unaware of seizure.

## 2017-11-17 ENCOUNTER — Emergency Department
Admission: EM | Admit: 2017-11-17 | Discharge: 2017-11-17 | Disposition: A | Discharge status: Home / Self Care | Payer: BLUE CROSS/BLUE SHIELD | Attending: Emergency Medicine | Admitting: Emergency Medicine

## 2017-11-17 ENCOUNTER — Encounter: Payer: Self-pay | Admitting: Emergency Medicine

## 2017-11-17 ENCOUNTER — Other Ambulatory Visit: Payer: Self-pay

## 2017-11-17 DIAGNOSIS — Z79899 Other long term (current) drug therapy: Secondary | ICD-10-CM | POA: Diagnosis not present

## 2017-11-17 DIAGNOSIS — E876 Hypokalemia: Secondary | ICD-10-CM | POA: Diagnosis not present

## 2017-11-17 DIAGNOSIS — Z87891 Personal history of nicotine dependence: Secondary | ICD-10-CM | POA: Diagnosis not present

## 2017-11-17 DIAGNOSIS — R569 Unspecified convulsions: Secondary | ICD-10-CM | POA: Diagnosis present

## 2017-11-17 DIAGNOSIS — I951 Orthostatic hypotension: Secondary | ICD-10-CM | POA: Diagnosis not present

## 2017-11-17 HISTORY — DX: Unspecified convulsions: R56.9

## 2017-11-17 LAB — BASIC METABOLIC PANEL
Anion gap: 16 — ABNORMAL HIGH (ref 5–15)
BUN: 16 mg/dL (ref 6–20)
CALCIUM: 9.2 mg/dL (ref 8.9–10.3)
CO2: 22 mmol/L (ref 22–32)
CREATININE: 0.92 mg/dL (ref 0.44–1.00)
Chloride: 100 mmol/L (ref 98–111)
GFR calc Af Amer: >60 mL/min (ref >60)
GFR calc non Af Amer: >60 mL/min (ref >60)
Glucose, Bld: 125 mg/dL — ABNORMAL HIGH (ref 70–99)
Potassium: 3.2 mmol/L — ABNORMAL LOW (ref 3.5–5.1)
Sodium: 138 mmol/L (ref 135–145)

## 2017-11-17 LAB — CBC WITH DIFFERENTIAL/PLATELET
Abs Immature Granulocytes: 0.02 10*3/uL (ref 0.00–0.07)
Basophils Absolute: 0 10*3/uL (ref 0.0–0.1)
Basophils Relative: 1 %
EOS ABS: 0 10*3/uL (ref 0.0–0.5)
EOS PCT: 1 %
HEMATOCRIT: 37.7 % (ref 36.0–46.0)
Hemoglobin: 12.7 g/dL (ref 12.0–15.0)
IMMATURE GRANULOCYTES: 0 %
LYMPHS ABS: 0.9 10*3/uL (ref 0.7–4.0)
Lymphocytes Relative: 20 %
MCH: 33.9 pg (ref 26.0–34.0)
MCHC: 33.7 g/dL (ref 30.0–36.0)
MCV: 100.5 fL — AB (ref 80.0–100.0)
MONO ABS: 0.3 10*3/uL (ref 0.1–1.0)
MONOS PCT: 7 %
NEUTROS PCT: 71 %
Neutro Abs: 3.4 10*3/uL (ref 1.7–7.7)
Platelets: 152 10*3/uL (ref 150–400)
RBC: 3.75 MIL/uL — ABNORMAL LOW (ref 3.87–5.11)
RDW: 11.5 % (ref 11.5–15.5)
WBC: 4.7 10*3/uL (ref 4.0–10.5)
nRBC: 0 % (ref 0.0–0.2)

## 2017-11-17 LAB — URINALYSIS, COMPLETE (UACMP) WITH MICROSCOPIC
BACTERIA UA: NONE SEEN
Bilirubin Urine: NEGATIVE
GLUCOSE, UA: NEGATIVE mg/dL
HGB URINE DIPSTICK: NEGATIVE
KETONES UR: NEGATIVE mg/dL
Nitrite: NEGATIVE
PROTEIN: 30 mg/dL — AB
RBC / HPF: >50 RBC/hpf — ABNORMAL HIGH (ref 0–5)
Specific Gravity, Urine: 1.019 (ref 1.005–1.030)
pH: 5 (ref 5.0–8.0)

## 2017-11-17 LAB — URINE DRUG SCREEN, QUALITATIVE (ARMC ONLY)
Amphetamines, Ur Screen: NOT DETECTED
BENZODIAZEPINE, UR SCRN: NOT DETECTED
Barbiturates, Ur Screen: NOT DETECTED
CANNABINOID 50 NG, UR ~~LOC~~: NOT DETECTED
Cocaine Metabolite,Ur ~~LOC~~: NOT DETECTED
MDMA (Ecstasy)Ur Screen: NOT DETECTED
Methadone Scn, Ur: NOT DETECTED
Opiate, Ur Screen: NOT DETECTED
PHENCYCLIDINE (PCP) UR S: NOT DETECTED
Tricyclic, Ur Screen: NOT DETECTED

## 2017-11-17 MED ORDER — LORAZEPAM 2 MG/ML IJ SOLN
1.0000 mg | Freq: Once | INTRAMUSCULAR | Status: AC
  Administered 2017-11-17: 1 mg via INTRAVENOUS
  Filled 2017-11-17: qty 1

## 2017-11-17 MED ORDER — CHLORDIAZEPOXIDE HCL 25 MG PO CAPS: ORAL_CAPSULE | ORAL | 0 refills | Status: AC

## 2017-11-17 MED ORDER — POTASSIUM CHLORIDE CRYS ER 20 MEQ PO TBCR
40.0000 meq | EXTENDED_RELEASE_TABLET | Freq: Once | ORAL | Status: AC
  Administered 2017-11-17: 40 meq via ORAL
  Filled 2017-11-17: qty 2

## 2017-11-17 NOTE — ED Triage Notes (Signed)
Pt to ED via [facility] with complaints of Seizures. Pt was at work her co workers noticed that she may pass out and lowered her to the floor where she has a 2 minute [employer] seizure (her last one being a few months ago, does not take medications). She was still postical when EMS arrived.

## 2017-11-17 NOTE — ED Notes (Signed)
Patient verbalized understanding of discharge instructions, no questions. Patient out of ED via wheelchair in no distress.  

## 2017-11-17 NOTE — Discharge Instructions (Addendum)
You are evaluated after a seizure, and it is most suspected this was due to alcohol withdrawal.  Make sure you do not abruptly stop drinking alcohol but taper it down to avoid alcohol withdrawal seizure.  After discussion with the neurologist, is not recommended that you be on any additional antiseizure medication.  Return to emergency department immediately for any worsening condition including additional seizure, confusion altered mental status, fever, dizziness passing out, chest pain, trouble breathing, or any other symptoms concerning to you.  Do not drink alcohol at the same time as the librium tablets     NOTE for work: Do not drive, operate heavy machinery, climb to heights, take a bath or swim without someone with you until you are evaluated and released by primary care doctor or neurologist after seizures considered under control.

## 2017-11-17 NOTE — ED Provider Notes (Signed)
[facility] Emergency Department Provider Note ____________________________________________   I have reviewed the triage vital signs and the triage nursing note.  HISTORY  Chief Complaint Seizures   Historian Patient  HPI Holly Whitney is a 57 y.o. female arrived by EMS after witnessed seizure.  Patient works in Personnel officer at [employer].  Sounds like coworker lowered her to the ground and she did not sustain any trauma.  She was apparently witnessed to have a generalized tonic-clonic seizure that lasted maybe around 2 minutes.  When EMS got there she was still postictal.  Here she is a little tired, but able to interact.  Reports last seizure was about a month ago.  She is a bit of a poor historian.  Reports that she is not on any seizure medication.  Reports after I questioned her based on review of prior records regarding alcohol use history and prior presumed alcohol withdrawal seizures with negative MRI and EEG work-up.  She states that she does not drink that much and states that she drinks a couple of beers per day.  States last drink was on Friday.  EMS reports that when he got to her her blood pressure was in the 80s when she stood up.  She received 250 cc normal saline and blood pressure was up to about 104 on arrival.  No reported fever, cough, trouble breathing, focal weakness or numbness, vomiting or diarrhea.   Did not bite tongue or lose continence.   Past Medical History:  Diagnosis Date  . Seizures [facility])     Patient Active Problem List   Diagnosis Date Noted  . New onset seizure (HCC) 07/04/2016  . Seizure disorder [facility])     History reviewed. No pertinent surgical history.  Prior to Admission medications   Medication Sig Start Date End Date Taking? Authorizing Provider  chlordiazePOXIDE (LIBRIUM) 25 MG capsule 75mg  three times per day for 3 days 50mg  three times per day for 3 days 25mg  three times per day for 3 days 11/17/17   Governor Rooks, MD  feeding supplement (BOOST / RESOURCE BREEZE) LIQD Take 1 Container by mouth 3 (three) times daily between meals. 07/05/16   Ramonita Lab, MD  folic acid (FOLVITE) 1 MG tablet Take 1 tablet (1 mg total) by mouth daily. 07/06/16   Ramonita Lab, MD  Multiple Vitamin (MULTIVITAMIN WITH MINERALS) TABS tablet Take 1 tablet by mouth daily. 07/06/16   Gouru, Deanna Artis, MD  senna-docusate (SENOKOT-S) 8.6-50 MG tablet Take 1 tablet by mouth at bedtime as needed for mild constipation. 07/05/16   Ramonita Lab, MD  thiamine 100 MG tablet Take 1 tablet (100 mg total) by mouth daily. 07/06/16   Ramonita Lab, MD    No Known Allergies  History reviewed. No pertinent family history.  Social History Social History   Tobacco Use  . Smoking status: Former Smoker    Types: Cigarettes    Last attempt to quit: 04/10/2009    Years since quitting: 8.6  . Smokeless tobacco: Never Used  Substance Use Topics  . Alcohol use: Yes    Comment: last drink Nov 1st 2019  . Drug use: No    Review of Systems  Constitutional: Negative for fever or recent illness. Eyes: Negative for visual changes. ENT: Negative for sore throat. Cardiovascular: Negative for chest pain. Respiratory: Negative for shortness of breath. Gastrointestinal: Negative for abdominal pain, vomiting and diarrhea. Genitourinary: Negative for dysuria. Musculoskeletal: Negative for back pain. Skin: Negative for rash. Neurological: Negative  for headache.  ____________________________________________   PHYSICAL EXAM:  VITAL SIGNS: ED Triage Vitals  Enc Vitals Group     BP 11/17/17 1337 117/75     Pulse Rate 11/17/17 1337 97     Resp 11/17/17 1337 20     Temp 11/17/17 1337 98.1 F (36.7 C)     Temp Source 11/17/17 1337 Oral     SpO2 11/17/17 1337 94 %     Weight 11/17/17 1338 132 lb 15 oz (60.3 kg)     Height 11/17/17 1338 5\' 3"  (1.6 m)     Head Circumference --      Peak Flow --      Pain Score 11/17/17 1338 0     Pain Loc --       Pain Edu? --      Excl. in GC? --      Constitutional: Alert and oriented.  HEENT      Head: Normocephalic and atraumatic.      Eyes: Conjunctivae are normal. Pupils equal and round.       Ears:         Nose: No congestion/rhinnorhea.      Mouth/Throat: Mucous membranes are moist.      Neck: No stridor. Cardiovascular/Chest: Normal rate, regular rhythm.  No murmurs, rubs, or gallops. Respiratory: Normal respiratory effort without tachypnea nor retractions. Breath sounds are clear and equal bilaterally. No wheezes/rales/rhonchi. Gastrointestinal: Soft. No distention, no guarding, no rebound. Nontender.    Genitourinary/rectal:Deferred Musculoskeletal: Nontender with normal range of motion in all extremities. No joint effusions.  No lower extremity tenderness.  No edema. Neurologic: No slurred speech.  Normal speech and language. No gross or focal neurologic deficits are appreciated.  No tremor. Skin:  Skin is warm, dry and intact. No rash noted. Psychiatric: Mood and affect are normal. Speech and behavior are normal. Patient exhibits appropriate insight and judgment.   ____________________________________________  LABS (pertinent positives/negatives) I, Governor Rooks, MD the attending physician have reviewed the labs noted below.  Labs Reviewed  BASIC METABOLIC PANEL - Abnormal; Notable for the following components:      Result Value   Potassium 3.2 (*)    Glucose, Bld 125 (*)    Anion gap 16 (*)    All other components within normal limits  CBC WITH DIFFERENTIAL/PLATELET - Abnormal; Notable for the following components:   RBC 3.75 (*)    MCV 100.5 (*)    All other components within normal limits  URINALYSIS, COMPLETE (UACMP) WITH MICROSCOPIC - Abnormal; Notable for the following components:   Color, Urine YELLOW (*)    APPearance TURBID (*)    Protein, ur 30 (*)    Leukocytes, UA SMALL (*)    RBC / HPF >50 (*)    All other components within normal limits  URINE DRUG  SCREEN, QUALITATIVE ([facility] ONLY)    ____________________________________________    EKG I, Governor Rooks, MD, the attending physician have personally viewed and interpreted all ECGs.  None ____________________________________________  RADIOLOGY   None   __________________________________________  PROCEDURES  Procedure(s) performed: None  Procedures  Critical Care performed: None   ____________________________________________  ED COURSE / ASSESSMENT AND PLAN  Pertinent labs & imaging results that were available during my care of the patient were reviewed by me and considered in my medical decision making (see chart for details).    Arrived after what sounds like was a witnessed generalized tonic-clonic seizure which extinguish on her own.  She is found to be  hypotensive, orthostatically and started on IV fluids.  I reviewed her prior discharge summary where she was seen by neurology, Dr. Thad Ranger and had a negative EEG and negative MRI and was discontinued/stopped off of Keppra with presumptive because of seizures alcohol withdrawal.  I will go ahead and give a dose of Ativan here.   Discussed with neurology, most likely etiology alcohol withdrawal, does not recommend starting antibiotics.  Patient requests Librium taper as opposed to starting back drinking alcohol.    CONSULTATIONS:   Reynolds, neurology -recommends no additional work-up or hospitalization.  Given alcohol withdrawal likely diagnosis, does not recommend starting any antiepileptic.  Seizure precautions.   Patient / Family / Caregiver informed of clinical course, medical decision-making process, and agree with plan.   I discussed return precautions, follow-up instructions, and discharge instructions with patient and/or family.  Discharge Instructions : Do not drive, operate heavy machinery, climb to heights, take a bath or swim without someone with you until you are evaluated and released by  primary care doctor or neurologist after seizures considered under control.  ___________________________________________   FINAL CLINICAL IMPRESSION(S) / ED DIAGNOSES   Final diagnoses:  Seizure (HCC)  Orthostatic hypotension  Hypokalemia      ___________________________________________         Note: This dictation was prepared with Dragon dictation. Any transcriptional errors that result from this process are unintentional    Governor Rooks, MD 11/17/17 1717

## 2018-04-27 ENCOUNTER — Encounter (HOSPITAL_COMMUNITY): Payer: Self-pay | Admitting: Emergency Medicine

## 2018-04-27 ENCOUNTER — Emergency Department (HOSPITAL_COMMUNITY): Payer: BLUE CROSS/BLUE SHIELD

## 2018-04-27 ENCOUNTER — Other Ambulatory Visit: Payer: Self-pay

## 2018-04-27 ENCOUNTER — Emergency Department (HOSPITAL_COMMUNITY)
Admission: EM | Admit: 2018-04-27 | Discharge: 2018-04-27 | Disposition: A | Discharge status: Home / Self Care | Payer: BLUE CROSS/BLUE SHIELD | Attending: Emergency Medicine | Admitting: Emergency Medicine

## 2018-04-27 DIAGNOSIS — Y999 Unspecified external cause status: Secondary | ICD-10-CM | POA: Insufficient documentation

## 2018-04-27 DIAGNOSIS — R569 Unspecified convulsions: Secondary | ICD-10-CM | POA: Insufficient documentation

## 2018-04-27 DIAGNOSIS — Z79899 Other long term (current) drug therapy: Secondary | ICD-10-CM | POA: Diagnosis not present

## 2018-04-27 DIAGNOSIS — Z87891 Personal history of nicotine dependence: Secondary | ICD-10-CM | POA: Insufficient documentation

## 2018-04-27 DIAGNOSIS — Y939 Activity, unspecified: Secondary | ICD-10-CM | POA: Diagnosis not present

## 2018-04-27 DIAGNOSIS — Y92009 Unspecified place in unspecified non-institutional (private) residence as the place of occurrence of the external cause: Secondary | ICD-10-CM | POA: Diagnosis not present

## 2018-04-27 DIAGNOSIS — W1830XA Fall on same level, unspecified, initial encounter: Secondary | ICD-10-CM | POA: Insufficient documentation

## 2018-04-27 DIAGNOSIS — S01112A Laceration without foreign body of left eyelid and periocular area, initial encounter: Secondary | ICD-10-CM | POA: Diagnosis not present

## 2018-04-27 LAB — CBC WITH DIFFERENTIAL/PLATELET
Abs Immature Granulocytes: 0.02 10*3/uL (ref 0.00–0.07)
Basophils Absolute: 0 10*3/uL (ref 0.0–0.1)
Basophils Relative: 1 %
Eosinophils Absolute: 0 10*3/uL (ref 0.0–0.5)
Eosinophils Relative: 1 %
HCT: 37.2 % (ref 36.0–46.0)
Hemoglobin: 11.7 g/dL — ABNORMAL LOW (ref 12.0–15.0)
Immature Granulocytes: 1 %
Lymphocytes Relative: 16 %
Lymphs Abs: 0.6 10*3/uL — ABNORMAL LOW (ref 0.7–4.0)
MCH: 32.6 pg (ref 26.0–34.0)
MCHC: 31.5 g/dL (ref 30.0–36.0)
MCV: 103.6 fL — ABNORMAL HIGH (ref 80.0–100.0)
Monocytes Absolute: 0.2 10*3/uL (ref 0.1–1.0)
Monocytes Relative: 6 %
Neutro Abs: 2.6 10*3/uL (ref 1.7–7.7)
Neutrophils Relative %: 75 %
Platelets: 133 10*3/uL — ABNORMAL LOW (ref 150–400)
RBC: 3.59 MIL/uL — ABNORMAL LOW (ref 3.87–5.11)
RDW: 14.2 % (ref 11.5–15.5)
WBC: 3.4 10*3/uL — ABNORMAL LOW (ref 4.0–10.5)
nRBC: 0 % (ref 0.0–0.2)

## 2018-04-27 LAB — POCT I-STAT EG7
Acid-base deficit: 2 mmol/L (ref 0.0–2.0)
Bicarbonate: 22.8 mmol/L (ref 20.0–28.0)
Calcium, Ion: 1.18 mmol/L (ref 1.15–1.40)
HCT: 32 % — ABNORMAL LOW (ref 36.0–46.0)
Hemoglobin: 10.9 g/dL — ABNORMAL LOW (ref 12.0–15.0)
O2 Saturation: 74 %
Potassium: 3.8 mmol/L (ref 3.5–5.1)
Sodium: 139 mmol/L (ref 135–145)
TCO2: 24 mmol/L (ref 22–32)
pCO2, Ven: 38.3 mmHg — ABNORMAL LOW (ref 44.0–60.0)
pH, Ven: 7.384 (ref 7.250–7.430)
pO2, Ven: 39 mmHg (ref 32.0–45.0)

## 2018-04-27 LAB — COMPREHENSIVE METABOLIC PANEL
ALT: 234 U/L — ABNORMAL HIGH (ref 0–44)
AST: 442 U/L — ABNORMAL HIGH (ref 15–41)
Albumin: 3.9 g/dL (ref 3.5–5.0)
Alkaline Phosphatase: 97 U/L (ref 38–126)
Anion gap: 18 — ABNORMAL HIGH (ref 5–15)
BUN: 10 mg/dL (ref 6–20)
CO2: 16 mmol/L — ABNORMAL LOW (ref 22–32)
Calcium: 8.8 mg/dL — ABNORMAL LOW (ref 8.9–10.3)
Chloride: 107 mmol/L (ref 98–111)
Creatinine, Ser: 0.81 mg/dL (ref 0.44–1.00)
GFR calc Af Amer: >60 mL/min (ref >60)
GFR calc non Af Amer: >60 mL/min (ref >60)
Glucose, Bld: 157 mg/dL — ABNORMAL HIGH (ref 70–99)
Potassium: 3 mmol/L — ABNORMAL LOW (ref 3.5–5.1)
Sodium: 141 mmol/L (ref 135–145)
Total Bilirubin: 0.6 mg/dL (ref 0.3–1.2)
Total Protein: 7.4 g/dL (ref 6.5–8.1)

## 2018-04-27 LAB — LACTIC ACID, PLASMA
Lactic Acid, Venous: 4.4 mmol/L (ref 0.5–1.9)
Lactic Acid, Venous: 1 mmol/L (ref 0.5–1.9)

## 2018-04-27 LAB — MAGNESIUM: Magnesium: 1.7 mg/dL (ref 1.7–2.4)

## 2018-04-27 LAB — ETHANOL: Alcohol, Ethyl (B): <10 mg/dL (ref <10)

## 2018-04-27 MED ORDER — LORAZEPAM 2 MG/ML IJ SOLN
INTRAMUSCULAR | Status: AC
  Administered 2018-04-27: 14:00:00 2 mg
  Filled 2018-04-27: qty 1

## 2018-04-27 MED ORDER — SODIUM CHLORIDE 0.9 % IV BOLUS
1000.0000 mL | Freq: Once | INTRAVENOUS | Status: AC
  Administered 2018-04-27: 1000 mL via INTRAVENOUS

## 2018-04-27 MED ORDER — LEVETIRACETAM IN NACL 1000 MG/100ML IV SOLN
1000.0000 mg | Freq: Two times a day (BID) | INTRAVENOUS | Status: DC
  Administered 2018-04-27: 14:00:00 1000 mg via INTRAVENOUS
  Filled 2018-04-27: qty 100

## 2018-04-27 MED ORDER — LEVETIRACETAM 500 MG PO TABS: 500.0000 mg | ORAL_TABLET | Freq: Two times a day (BID) | ORAL | 0 refills | Status: AC

## 2018-04-27 NOTE — Discharge Instructions (Signed)
No driving for at least 6 months or until cleared by a neurologist.  You should also not do anything that you could harm yourself if he had a seizure while you are doing it, no swimming no scuba diving no climbing to tall heights.  If you do not already have a family physician please call the hotline provided to find one.

## 2018-04-27 NOTE — ED Provider Notes (Signed)
I received the patient in signout from Ebbie Ridge, PA-C and Dr. Rhunette Croft.  Briefly the patient is a 58 year old female that had what was initially thought to be syncopal event and then had a seizure while in the emergency department.  Case was discussed with the neurologist and she was given a loading dose of Keppra.  She had initially a lactic acidosis that had resolved with IV fluids.  The plan was for repeat assessment for improvement to her baseline and if she achieved baseline to discharge with a prescription for Keppra and neurology outpatient follow-up.  I reassessed the patient, she states that she is back at her baseline.  Is able to carry on a conversation with me.  We will discharge her with a prescription for Keppra.  Neurology follow-up.  1. Seizure (HCC)       Melene Plan, DO 04/27/18 1626

## 2018-04-27 NOTE — ED Notes (Signed)
[name] (patients sister) informed RN that patients family will be picking up patient shortly and does not need a cab anymore. Patient wheeled out to lobby.

## 2018-04-27 NOTE — Progress Notes (Addendum)
CSW received a call from [facility] RN CM stating pt needs a taxi.  CSW responded to consult, reviewed chart and sees family is picking up the pt.  Please reconsult if future social work needs arise.  CSW signing off, as social work intervention is no longer needed.  Dorothe Pea. Naser Schuld, LCSW, LCAS, CSI Transitions of Care Clinical Social Worker Care Coordination Department Ph: [phone]

## 2018-04-27 NOTE — ED Notes (Signed)
RN spoke with patients sister [name] and states unsure if patient has a ride home that she will need a cab ride. Social work contacted regarding a Electronics engineer.

## 2018-04-27 NOTE — ED Triage Notes (Signed)
Pt is here from home with c/i of syncopal episode while sitting at the dinning room table , pt has hx of seizures but doesn't take meds , cbg 128, pt is c/o left shoulder plain and alert to self only

## 2018-04-27 NOTE — ED Provider Notes (Signed)
[facility] EMERGENCY DEPARTMENT Provider Note   CSN: [id number] Arrival date & time: 04/27/18  1013    History   Chief Complaint Chief Complaint  Patient presents with  . Loss of Consciousness    HPI Holly Whitney is a 59 y.o. female.     HPI Patient presents to the emergency department with what appears to be a seizure that occurred just prior to arrival.  The patient fell onto the floor at home.  The husband states that she aroused after about 5 minutes.  There was some mild shaking that occurred.  The patient does drink alcohol on a daily basis.  Patient is able to give me a full history on my examination.  Patient denies any pain or any other issues.  Does have a laceration above the left eye.  The patient denies chest pain, shortness of breath, headache,blurred vision, neck pain, fever, cough, weakness, numbness, dizziness, anorexia, edema, abdominal pain, nausea, vomiting, diarrhea, rash, back pain, dysuria, hematemesis, bloody stool, near syncope, or syncope. Past Medical History:  Diagnosis Date  . Seizures [facility])     Patient Active Problem List   Diagnosis Date Noted  . New onset seizure (HCC) 07/04/2016  . Seizure disorder [facility])     History reviewed. No pertinent surgical history.   OB History   No obstetric history on file.      Home Medications    Prior to Admission medications   Medication Sig Start Date End Date Taking? Authorizing Provider  chlordiazePOXIDE (LIBRIUM) 25 MG capsule 75mg  three times per day for 3 days 50mg  three times per day for 3 days 25mg  three times per day for 3 days 11/17/17   Governor Rooks, MD  feeding supplement (BOOST / RESOURCE BREEZE) LIQD Take 1 Container by mouth 3 (three) times daily between meals. 07/05/16   Ramonita Lab, MD  folic acid (FOLVITE) 1 MG tablet Take 1 tablet (1 mg total) by mouth daily. 07/06/16   Ramonita Lab, MD  Multiple Vitamin (MULTIVITAMIN WITH MINERALS) TABS tablet Take 1 tablet by  mouth daily. 07/06/16   Gouru, Deanna Artis, MD  senna-docusate (SENOKOT-S) 8.6-50 MG tablet Take 1 tablet by mouth at bedtime as needed for mild constipation. 07/05/16   Ramonita Lab, MD  thiamine 100 MG tablet Take 1 tablet (100 mg total) by mouth daily. 07/06/16   Ramonita Lab, MD    Family History History reviewed. No pertinent family history.  Social History Social History   Tobacco Use  . Smoking status: Former Smoker    Types: Cigarettes    Last attempt to quit: 04/10/2009    Years since quitting: 9.0  . Smokeless tobacco: Never Used  Substance Use Topics  . Alcohol use: Yes    Comment: last drink Nov 1st 2019  . Drug use: No     Allergies   Patient has no known allergies.   Review of Systems Review of Systems All other systems negative except as documented in the HPI. All pertinent positives and negatives as reviewed in the HPI.  Physical Exam Updated Vital Signs BP 121/77   Pulse 100   Temp 97.9 F (36.6 C) (Oral)   Resp 15   Ht 5\' 2"  (1.575 m)   Wt 65.8 kg   SpO2 98%   BMI 26.52 kg/m   Physical Exam Vitals signs and nursing note reviewed.  Constitutional:      General: She is not in acute distress.    Appearance: She is well-developed.  HENT:     Head: Normocephalic.   Eyes:     Pupils: Pupils are equal, round, and reactive to light.  Neck:     Musculoskeletal: Normal range of motion and neck supple.  Cardiovascular:     Rate and Rhythm: Normal rate and regular rhythm.     Heart sounds: Normal heart sounds. No murmur. No friction rub. No gallop.   Pulmonary:     Effort: Pulmonary effort is normal. No respiratory distress.     Breath sounds: Normal breath sounds. No wheezing.  Skin:    General: Skin is warm and dry.     Capillary Refill: Capillary refill takes less than 2 seconds.     Findings: No erythema or rash.  Neurological:     General: No focal deficit present.     Mental Status: She is alert and oriented to person, place, and time. Mental  status is at baseline.     Motor: No weakness or abnormal muscle tone.     Coordination: Coordination normal.  Psychiatric:        Behavior: Behavior normal.      ED Treatments / Results  Labs (all labs ordered are listed, but only abnormal results are displayed) Labs Reviewed  COMPREHENSIVE METABOLIC PANEL - Abnormal; Notable for the following components:      Result Value   Potassium 3.0 (*)    CO2 16 (*)    Glucose, Bld 157 (*)    Calcium 8.8 (*)    AST 442 (*)    ALT 234 (*)    Anion gap 18 (*)    All other components within normal limits  CBC WITH DIFFERENTIAL/PLATELET - Abnormal; Notable for the following components:   WBC 3.4 (*)    RBC 3.59 (*)    Hemoglobin 11.7 (*)    MCV 103.6 (*)    Platelets 133 (*)    Lymphs Abs 0.6 (*)    All other components within normal limits  LACTIC ACID, PLASMA - Abnormal; Notable for the following components:   Lactic Acid, Venous 4.4 (*)    All other components within normal limits  POCT I-STAT EG7 - Abnormal; Notable for the following components:   pCO2, Ven 38.3 (*)    HCT 32.0 (*)    Hemoglobin 10.9 (*)    All other components within normal limits  ETHANOL  LACTIC ACID, PLASMA  MAGNESIUM  URINALYSIS, ROUTINE W REFLEX MICROSCOPIC  RAPID URINE DRUG SCREEN, HOSP PERFORMED  I-STAT VENOUS BLOOD GAS, ED    EKG EKG Interpretation  Date/Time:  Monday April 27 2018 10:20:45 EDT Ventricular Rate:  87 PR Interval:    QRS Duration: 91 QT Interval:  376 QTC Calculation: 453 R Axis:   80 Text Interpretation:  Sinus rhythm Consider left atrial enlargement ST elevation, consider inferior injury No acute changes No significant change since last tracing Confirmed by Derwood Kaplan [phone]) on 04/27/2018 1:40:40 PM   Radiology Ct Head Wo Contrast  Result Date: 04/27/2018 CLINICAL DATA:  Syncopal episode with laceration above the left orbit. EXAM: CT HEAD WITHOUT CONTRAST CT MAXILLOFACIAL WITHOUT CONTRAST TECHNIQUE: Multidetector CT  imaging of the head and maxillofacial structures were performed using the standard protocol without intravenous contrast. Multiplanar CT image reconstructions of the maxillofacial structures were also generated. COMPARISON:  CT and MRI June 2018 FINDINGS: CT HEAD FINDINGS Brain: The brain has normal appearance without evidence of old or acute infarction, mass lesion, hemorrhage, hydrocephalus or extra-axial collection. Vascular: There is atherosclerotic calcification  of the major vessels at the base of the brain. Skull: No evidence of skull fracture. Other: None CT MAXILLOFACIAL FINDINGS Osseous: No facial fracture. Orbits: No injury to the globe or other intraorbital structures. Sinuses: Inflammatory type opacification of the diminutive right division of the frontal sinus. Other sinuses are clear. Soft tissues: Mild soft tissue swelling in the left supraorbital region. IMPRESSION: Head CT: No intracranial injury.  Normal CT appearance of the brain. Maxillofacial CT: No facial fracture. Soft tissue injury in the left supraorbital region. No evidence of orbital injury. Inflammatory type opacification of the diminutive right division of the frontal sinus. Electronically Signed   By: Paulina FusiMark  Shogry M.D.   On: 04/27/2018 11:25   Ct Maxillofacial Wo Cm  Result Date: 04/27/2018 CLINICAL DATA:  Syncopal episode with laceration above the left orbit. EXAM: CT HEAD WITHOUT CONTRAST CT MAXILLOFACIAL WITHOUT CONTRAST TECHNIQUE: Multidetector CT imaging of the head and maxillofacial structures were performed using the standard protocol without intravenous contrast. Multiplanar CT image reconstructions of the maxillofacial structures were also generated. COMPARISON:  CT and MRI June 2018 FINDINGS: CT HEAD FINDINGS Brain: The brain has normal appearance without evidence of old or acute infarction, mass lesion, hemorrhage, hydrocephalus or extra-axial collection. Vascular: There is atherosclerotic calcification of the major  vessels at the base of the brain. Skull: No evidence of skull fracture. Other: None CT MAXILLOFACIAL FINDINGS Osseous: No facial fracture. Orbits: No injury to the globe or other intraorbital structures. Sinuses: Inflammatory type opacification of the diminutive right division of the frontal sinus. Other sinuses are clear. Soft tissues: Mild soft tissue swelling in the left supraorbital region. IMPRESSION: Head CT: No intracranial injury.  Normal CT appearance of the brain. Maxillofacial CT: No facial fracture. Soft tissue injury in the left supraorbital region. No evidence of orbital injury. Inflammatory type opacification of the diminutive right division of the frontal sinus. Electronically Signed   By: Paulina FusiMark  Shogry M.D.   On: 04/27/2018 11:25    Procedures Procedures (including critical care time)  Medications Ordered in ED Medications  levETIRAcetam (KEPPRA) IVPB 1000 mg/100 mL premix (0 mg Intravenous Stopped 04/27/18 1424)  sodium chloride 0.9 % bolus 1,000 mL (0 mLs Intravenous Stopped 04/27/18 1242)  sodium chloride 0.9 % bolus 1,000 mL (0 mLs Intravenous Stopped 04/27/18 1405)  LORazepam (ATIVAN) 2 MG/ML injection (2 mg  Given 04/27/18 1400)     Initial Impression / Assessment and Plan / ED Course  I have reviewed the triage vital signs and the nursing notes.  Pertinent labs & imaging results that were available during my care of the patient were reviewed by me and considered in my medical decision making (see chart for details).        LACERATION REPAIR Performed by: Carlyle Dollyhristopher W Bettyjean Stefanski Authorized by: Jamesetta Orleanshristopher W Menno Vanbergen Consent: Verbal consent obtained. Risks and benefits: risks, benefits and alternatives were discussed Consent given by: patient Patient identity confirmed: provided demographic data Prepped and Draped in normal sterile fashion Wound explored  Laceration Location: L periorbital region  Laceration Length: 2.5cm  No Foreign Bodies seen or palpated   Anesthesia: local infiltration  Local anesthetic: lidocaine 2% w epinephrine  Anesthetic total: 5 ml  Irrigation method: syringe Amount of cleaning: standard  Skin closure: 5-0 prolene  Number of sutures: 4  Technique: simple interrupted.   Patient tolerance: Patient tolerated the procedure well with no immediate complications.   Patient had a seizure here in the emergency department.  She was given thousand milligrams of Keppra  IV.  Will observe her until she is clear and will have her follow-up if she is able to be discharged with neurology.  We will start her on Keppra 500 twice daily.  Dr. Adela Lank will reassess the patient and make a decision on whether she can be discharged or admitted.  Final Clinical Impressions(s) / ED Diagnoses   Final diagnoses:  None    ED Discharge Orders    None       Charlestine Night, PA-C 04/27/18 1517    Derwood Kaplan, MD 04/28/18 [phone]

## 2020-04-02 IMAGING — CT CT MAXILLOFACIAL WITHOUT CONTRAST
3 of 7 series · 15 of 47 positions shown, 18 images · non-contrast
Comparison: CT and MRI June 2016

CLINICAL DATA: Syncopal episode with laceration above the left
orbit.

EXAM:
CT HEAD WITHOUT CONTRAST
CT MAXILLOFACIAL WITHOUT CONTRAST
TECHNIQUE: Multidetector CT imaging of the head and maxillofacial structures
were performed using the standard protocol without intravenous
contrast. Multiplanar CT image reconstructions of the maxillofacial
structures were also generated.

[Series 7: facialbone 2.0 st · axial · 0.29mm/px · z∈[+854,+986]mm · 10 of 80 slices shown, 13 images]
[im 7/80  brain]
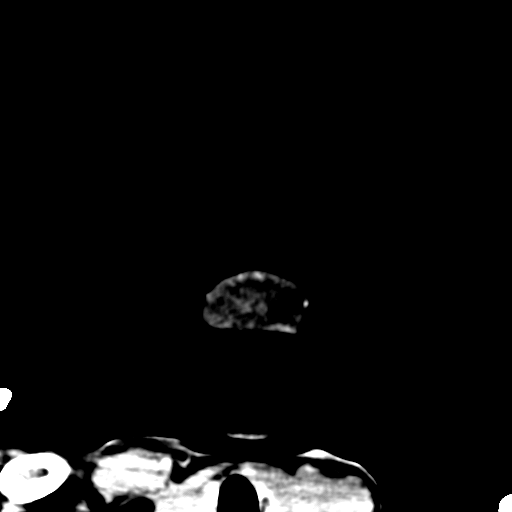
[im 7/80  bone]
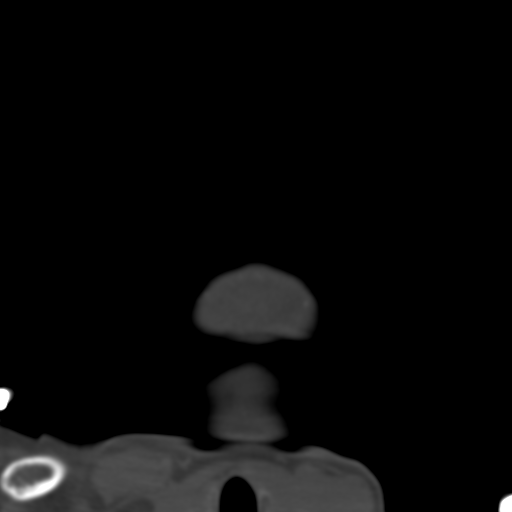
[im 14/80  bone]
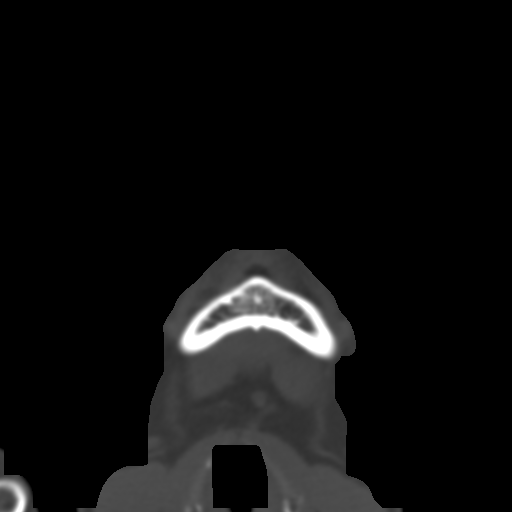
[im 20/80  bone]
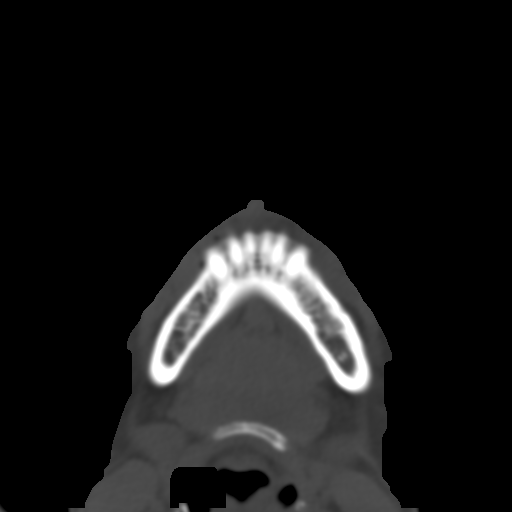
[im 27/80  bone]
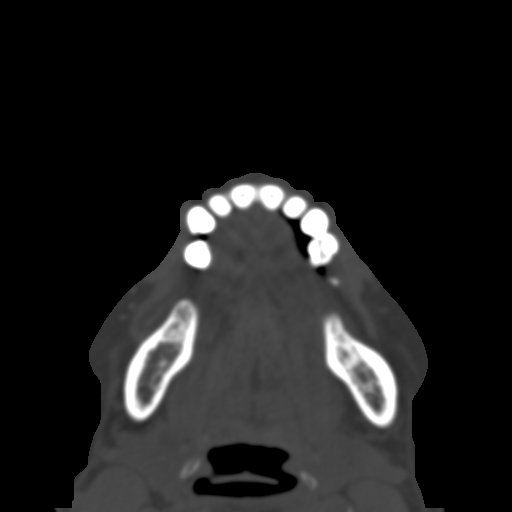
[im 33/80  brain]
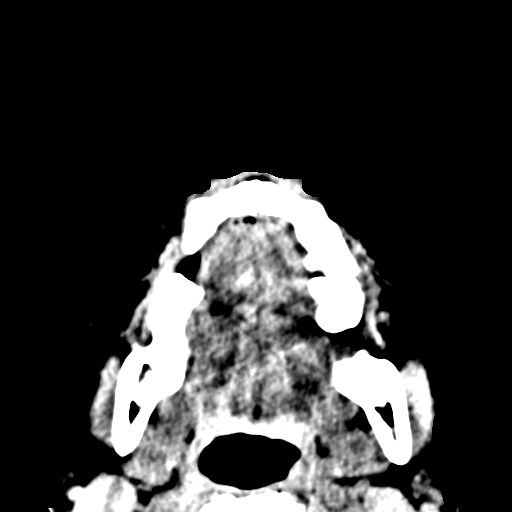
[im 33/80  bone]
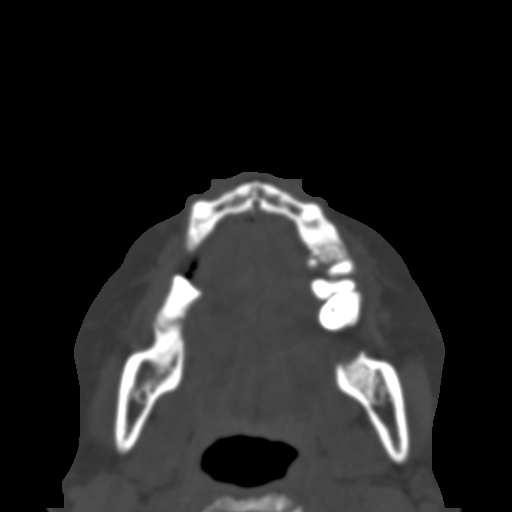
[im 47/80  bone]
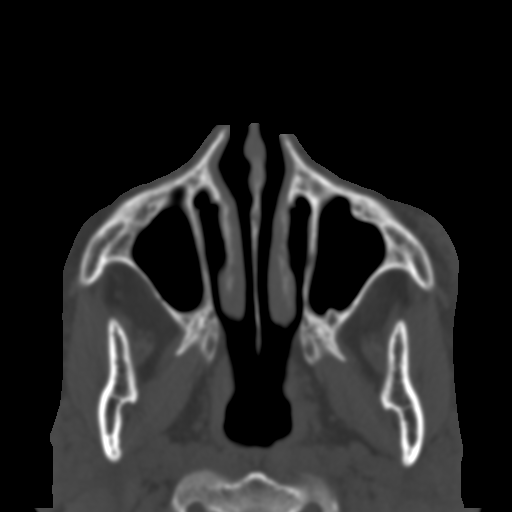
[im 53/80  bone]
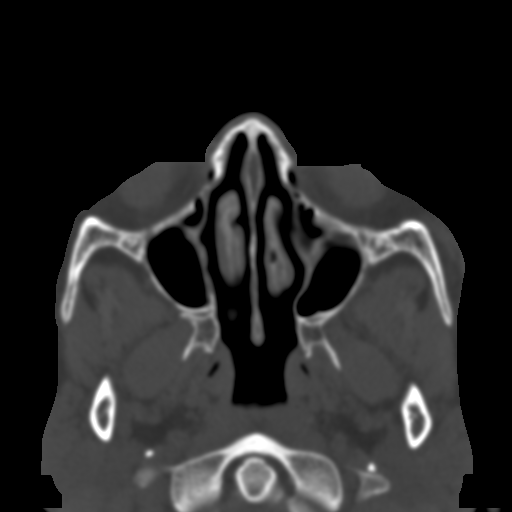
[im 60/80  bone]
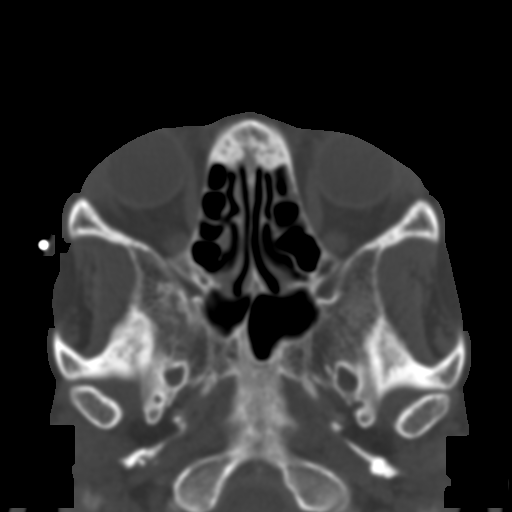
[im 66/80  brain]
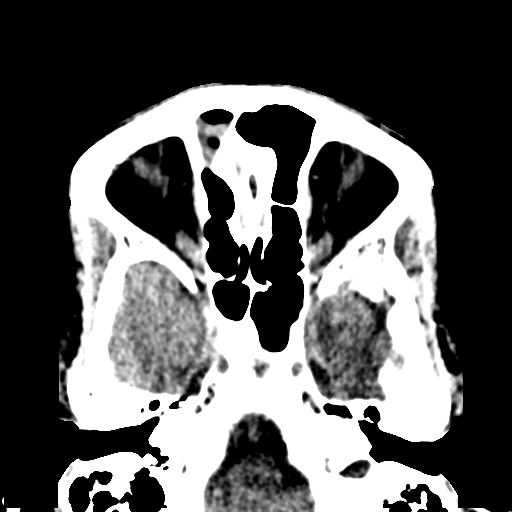
[im 66/80  bone]
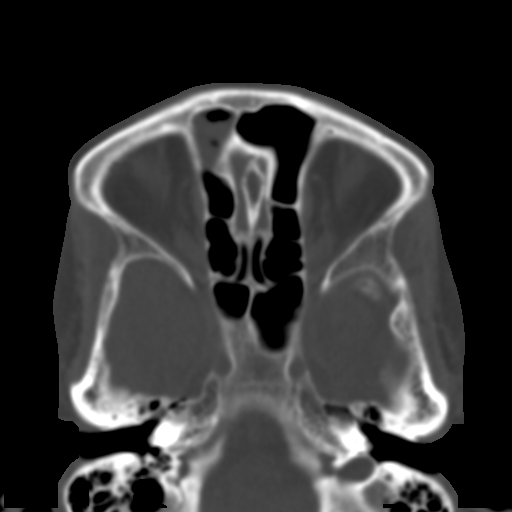
[im 73/80  bone]
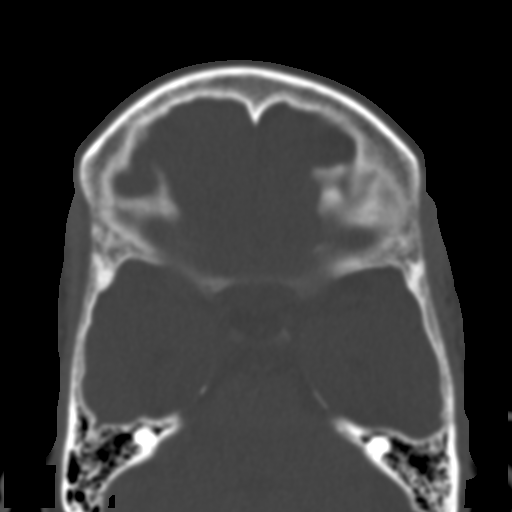

[Series 11: facialbone 2.0 cor st · coronal · 0.31mm/px · 3 of 74 slices shown]
[im 18/74  bone]
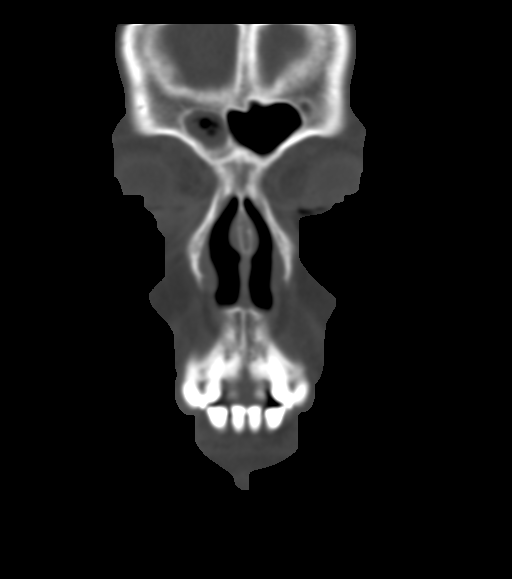
[im 32/74  bone]
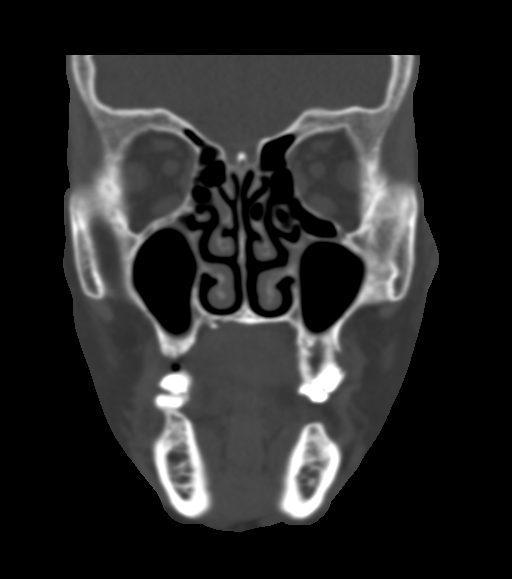
[im 46/74  bone]
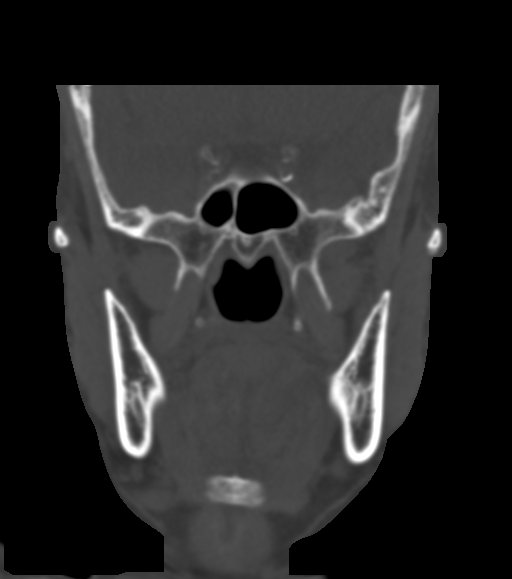

[Series 12: facialbone 2.0 sag st · sagittal · 0.29mm/px · 2 of 81 slices shown]
[im 27/81  bone]
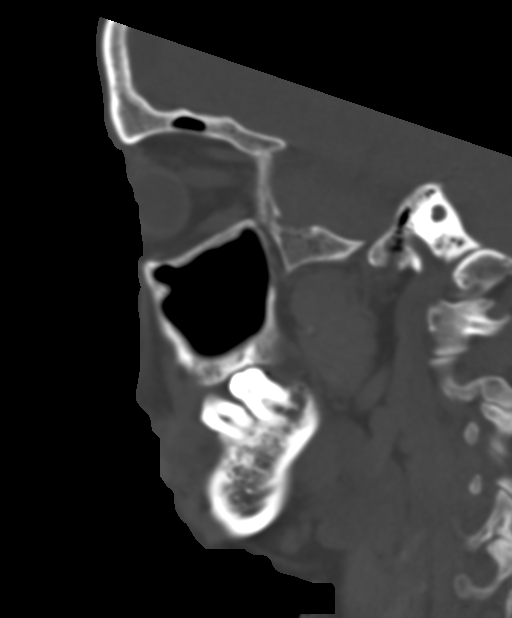
[im 54/81  bone]
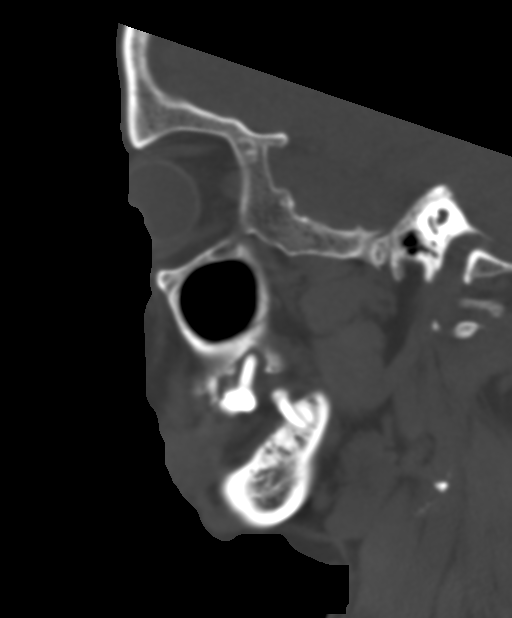

[15 of 47 positions shown; findings below may reference images not displayed]

FINDINGS: CT HEAD FINDINGS

Brain: The brain has normal appearance without evidence of old or
acute infarction, mass lesion, hemorrhage, hydrocephalus or
extra-axial collection.

Vascular: There is atherosclerotic calcification of the major
vessels at the base of the brain.

Skull: No evidence of skull fracture.

Other: None

CT MAXILLOFACIAL FINDINGS

Osseous: No facial fracture.

Orbits: No injury to the globe or other intraorbital structures.

Sinuses: Inflammatory type opacification of the diminutive right
division of the frontal sinus. Other sinuses are clear.

Soft tissues: Mild soft tissue swelling in the left supraorbital
region.
IMPRESSION: Head CT: No intracranial injury.  Normal CT appearance of the brain.

Maxillofacial CT: No facial fracture. Soft tissue injury in the left
supraorbital region. No evidence of orbital injury.

Inflammatory type opacification of the diminutive right division of
the frontal sinus.

## 2021-11-30 ENCOUNTER — Ambulatory Visit (LOCAL_COMMUNITY_HEALTH_CENTER): Payer: Self-pay

## 2021-11-30 DIAGNOSIS — Z719 Counseling, unspecified: Secondary | ICD-10-CM

## 2021-11-30 DIAGNOSIS — Z23 Encounter for immunization: Secondary | ICD-10-CM

## 2021-11-30 NOTE — Progress Notes (Signed)
Client seen in nurse clinic for COVID-19 vaccine.   Are you feeling sick today? No   Have you ever received a dose of COVID-19 Vaccine? [employer], [place], [place], [place], Other) Yes  If yes, which vaccine and how many doses?    Pfizer 3   Did you bring the vaccination record card or other documentation?  Yes   Do you have a health condition or are undergoing treatment that makes you moderately or severely immunocompromised? This would include, but not be limited to: cancer, HIV, organ transplant, immunosuppressive therapy/high-dose corticosteroids, or moderate/severe primary immunodeficiency.  No  Have you received COVID-19 vaccine before or during hematopoietic cell transplant (HCT) or CAR-T-cell therapies? No  Have you ever had an allergic reaction to: (This would include a severe allergic reaction or a reaction that caused hives, swelling, or respiratory distress, including wheezing.) A component of a COVID-19 vaccine or a previous dose of COVID-19 vaccine? No   Have you ever had an allergic reaction to another vaccine (other thanCOVID-19 vaccine) or an injectable medication? (This would include a severe allergic reaction or a reaction that caused hives, swelling, or respiratory distress, including wheezing.)   No    Do you have a history of any of the following:  Myocarditis or Pericarditis No  Dermal fillers:  No  Multisystem Inflammatory Syndrome (MIS-C or MIS-A)? No  COVID-19 disease within the past 3 months? No  Vaccinated with monkeypox vaccine in the last 4 weeks? No    Vaccine administered and tolerated well.  After vaccine care reviewed.  Declined staying for 15 minutes for observation.    Ociel Retherford Sherrilyn Rist, RN

## 2023-01-06 ENCOUNTER — Other Ambulatory Visit: Payer: Self-pay | Admitting: Internal Medicine

## 2023-01-06 DIAGNOSIS — Z1231 Encounter for screening mammogram for malignant neoplasm of breast: Secondary | ICD-10-CM

## 2023-06-25 ENCOUNTER — Ambulatory Visit
Admission: RE | Admit: 2023-06-25 | Discharge: 2023-06-25 | Disposition: A | Discharge status: Home / Self Care | Payer: PRIVATE HEALTH INSURANCE | Source: Ambulatory Visit | Attending: Internal Medicine | Admitting: Internal Medicine

## 2023-06-25 DIAGNOSIS — Z1231 Encounter for screening mammogram for malignant neoplasm of breast: Secondary | ICD-10-CM | POA: Diagnosis not present

## 2023-07-02 ENCOUNTER — Other Ambulatory Visit: Payer: Self-pay | Admitting: Internal Medicine

## 2023-07-02 DIAGNOSIS — R928 Other abnormal and inconclusive findings on diagnostic imaging of breast: Secondary | ICD-10-CM

## 2023-07-04 ENCOUNTER — Ambulatory Visit
Admission: RE | Admit: 2023-07-04 | Discharge: 2023-07-04 | Disposition: A | Discharge status: Home / Self Care | Payer: PRIVATE HEALTH INSURANCE | Source: Ambulatory Visit | Attending: Internal Medicine | Admitting: Internal Medicine

## 2023-07-04 DIAGNOSIS — R928 Other abnormal and inconclusive findings on diagnostic imaging of breast: Secondary | ICD-10-CM | POA: Diagnosis present

## 2023-11-21 ENCOUNTER — Encounter: Payer: Self-pay | Admitting: Internal Medicine

## 2023-11-26 ENCOUNTER — Other Ambulatory Visit: Payer: Self-pay | Admitting: Internal Medicine

## 2023-11-26 DIAGNOSIS — R921 Mammographic calcification found on diagnostic imaging of breast: Secondary | ICD-10-CM

## 2024-01-05 ENCOUNTER — Ambulatory Visit
Admission: RE | Admit: 2024-01-05 | Discharge: 2024-01-05 | Disposition: A | Discharge status: Home / Self Care | Source: Ambulatory Visit | Attending: Internal Medicine | Admitting: Internal Medicine

## 2024-01-05 DIAGNOSIS — R921 Mammographic calcification found on diagnostic imaging of breast: Secondary | ICD-10-CM | POA: Diagnosis present
